# Patient Record
Sex: Male | Born: 1935 | Race: White | Hispanic: No | Marital: Single | Smoking: Never smoker
Health system: Southern US, Community
[De-identification: ages and names within clinical notes are randomized; demographics above are authoritative.]

## PROBLEM LIST (undated history)

## (undated) ENCOUNTER — Emergency Department: Admission: EM | Payer: Medicare Other | Source: Home / Self Care

## (undated) DIAGNOSIS — Z9842 Cataract extraction status, left eye: Secondary | ICD-10-CM

## (undated) DIAGNOSIS — I219 Acute myocardial infarction, unspecified: Secondary | ICD-10-CM

## (undated) DIAGNOSIS — R519 Headache, unspecified: Secondary | ICD-10-CM

## (undated) DIAGNOSIS — R0600 Dyspnea, unspecified: Secondary | ICD-10-CM

## (undated) DIAGNOSIS — I2510 Atherosclerotic heart disease of native coronary artery without angina pectoris: Secondary | ICD-10-CM

## (undated) DIAGNOSIS — M1990 Unspecified osteoarthritis, unspecified site: Secondary | ICD-10-CM

## (undated) DIAGNOSIS — Z9841 Cataract extraction status, right eye: Secondary | ICD-10-CM

## (undated) DIAGNOSIS — M51369 Other intervertebral disc degeneration, lumbar region without mention of lumbar back pain or lower extremity pain: Secondary | ICD-10-CM

## (undated) DIAGNOSIS — I420 Dilated cardiomyopathy: Secondary | ICD-10-CM

## (undated) DIAGNOSIS — I5189 Other ill-defined heart diseases: Secondary | ICD-10-CM

## (undated) DIAGNOSIS — I10 Essential (primary) hypertension: Secondary | ICD-10-CM

## (undated) DIAGNOSIS — I38 Endocarditis, valve unspecified: Secondary | ICD-10-CM

## (undated) DIAGNOSIS — R0609 Other forms of dyspnea: Secondary | ICD-10-CM

## (undated) DIAGNOSIS — E785 Hyperlipidemia, unspecified: Secondary | ICD-10-CM

## (undated) DIAGNOSIS — Z951 Presence of aortocoronary bypass graft: Secondary | ICD-10-CM

## (undated) DIAGNOSIS — M5136 Other intervertebral disc degeneration, lumbar region: Secondary | ICD-10-CM

## (undated) DIAGNOSIS — I6529 Occlusion and stenosis of unspecified carotid artery: Secondary | ICD-10-CM

## (undated) HISTORY — PX: TONSILLECTOMY: SUR1361

## (undated) HISTORY — PX: EYE SURGERY: SHX253

## (undated) HISTORY — PX: ESOPHAGEAL DILATION: SHX303

## (undated) HISTORY — PX: CATARACT EXTRACTION: SUR2

## (undated) HISTORY — PX: CORONARY ARTERY BYPASS GRAFT: SHX141

## (undated) HISTORY — PX: KNEE ARTHROSCOPY: SUR90

## (undated) HISTORY — PX: ROTATOR CUFF REPAIR: SHX139

---

## 2005-07-15 DIAGNOSIS — I219 Acute myocardial infarction, unspecified: Secondary | ICD-10-CM

## 2005-07-15 HISTORY — DX: Acute myocardial infarction, unspecified: I21.9

## 2005-08-04 ENCOUNTER — Inpatient Hospital Stay: Payer: Self-pay | Admitting: Internal Medicine

## 2005-08-04 ENCOUNTER — Other Ambulatory Visit: Payer: Self-pay

## 2005-08-05 DIAGNOSIS — I2510 Atherosclerotic heart disease of native coronary artery without angina pectoris: Secondary | ICD-10-CM

## 2005-08-05 HISTORY — DX: Atherosclerotic heart disease of native coronary artery without angina pectoris: I25.10

## 2005-08-07 DIAGNOSIS — Z951 Presence of aortocoronary bypass graft: Secondary | ICD-10-CM

## 2005-08-07 HISTORY — DX: Presence of aortocoronary bypass graft: Z95.1

## 2005-08-07 HISTORY — PX: CORONARY ARTERY BYPASS GRAFT: SHX141

## 2005-11-10 ENCOUNTER — Encounter: Payer: Self-pay | Admitting: Internal Medicine

## 2005-11-12 ENCOUNTER — Encounter: Payer: Self-pay | Admitting: Internal Medicine

## 2005-12-13 ENCOUNTER — Encounter: Payer: Self-pay | Admitting: Internal Medicine

## 2006-01-12 ENCOUNTER — Encounter: Payer: Self-pay | Admitting: Internal Medicine

## 2006-02-12 ENCOUNTER — Encounter: Payer: Self-pay | Admitting: Internal Medicine

## 2006-12-02 ENCOUNTER — Ambulatory Visit: Payer: Self-pay | Admitting: Gastroenterology

## 2007-10-03 ENCOUNTER — Ambulatory Visit: Payer: Self-pay | Admitting: Emergency Medicine

## 2008-02-24 ENCOUNTER — Ambulatory Visit: Payer: Self-pay | Admitting: Internal Medicine

## 2008-02-26 ENCOUNTER — Ambulatory Visit: Payer: Self-pay | Admitting: Internal Medicine

## 2008-03-13 ENCOUNTER — Ambulatory Visit: Payer: Self-pay | Admitting: Internal Medicine

## 2008-04-04 ENCOUNTER — Ambulatory Visit: Payer: Self-pay | Admitting: Orthopedic Surgery

## 2010-01-30 ENCOUNTER — Ambulatory Visit: Payer: Self-pay | Admitting: Internal Medicine

## 2012-03-23 ENCOUNTER — Ambulatory Visit: Payer: Self-pay | Admitting: Gastroenterology

## 2014-07-04 ENCOUNTER — Ambulatory Visit: Payer: Self-pay | Admitting: Unknown Physician Specialty

## 2014-08-01 ENCOUNTER — Ambulatory Visit: Payer: Self-pay | Admitting: Unknown Physician Specialty

## 2014-08-01 LAB — CBC WITH DIFFERENTIAL/PLATELET
Basophil #: 0.1 10*3/uL (ref 0.0–0.1)
Basophil %: 1.2 %
EOS ABS: 0.4 10*3/uL (ref 0.0–0.7)
Eosinophil %: 4.9 %
HCT: 44.9 % (ref 40.0–52.0)
HGB: 14.9 g/dL (ref 13.0–18.0)
LYMPHS ABS: 1.7 10*3/uL (ref 1.0–3.6)
Lymphocyte %: 23.2 %
MCH: 29.8 pg (ref 26.0–34.0)
MCHC: 33.2 g/dL (ref 32.0–36.0)
MCV: 90 fL (ref 80–100)
Monocyte #: 0.4 x10 3/mm (ref 0.2–1.0)
Monocyte %: 4.8 %
NEUTROS PCT: 65.9 %
Neutrophil #: 5 10*3/uL (ref 1.4–6.5)
Platelet: 240 10*3/uL (ref 150–440)
RBC: 4.99 10*6/uL (ref 4.40–5.90)
RDW: 13.9 % (ref 11.5–14.5)
WBC: 7.5 10*3/uL (ref 3.8–10.6)

## 2014-08-08 ENCOUNTER — Ambulatory Visit: Payer: Self-pay | Admitting: Unknown Physician Specialty

## 2015-01-05 NOTE — Op Note (Signed)
PATIENT NAME:  Gregory [name], Gregory [name] MR#:  [id number] DATE OF BIRTH:  04/09/1932  DATE OF PROCEDURE:  08/08/2014  PREOPERATIVE DIAGNOSIS: Torn right rotator cuff with secondary impingement and  long head of the biceps tendinosis.   POSTOPERATIVE DIAGNOSIS: Torn right rotator cuff with secondary impingement and long head of the biceps tendinosis.  OPERATION: Arthroscopic release of the long head of the biceps tendon followed by arthroscopic subacromial decompression and then mini-incision  rotator cuff repair.   SURGEON: Alda BertholdHarold B. Shadeed Colberg Jr., MD   ANESTHESIA: Interscalene block plus general.   HISTORY: The patient had a long history of right shoulder discomfort. He did have a fall earlier this year. Plain films did not reveal any significant pathology. An MRI was consistent with a large cuff tear and long head of the biceps tendinosis. The patient was ultimately brought in for surgery due to his failure to respond to conservative treatment.   DESCRIPTION OF PROCEDURE: The patient was taken the Operating Room where satisfactory interscalene block was achieved on his right shoulder. Next, satisfactory general anesthesia was achieved. The patient was turned supine and then his right shoulder was prepped and draped in the usual fashion for a procedure about the shoulder. The patient was given 2 grams of Kefzol IV prior to the start of the procedure. The shoulder was suspended with an Acufex shoulder suspension device. The shoulder was maintained in about 30 degrees of abduction and about 10 to 15 degrees of forward flexion and 12 pounds of traction was used throughout most of the procedure.   The scope was introduced through a posterior puncture wound into the glenohumeral joint. The joint was distended with lactated Ringer's. Inspection of the joint revealed that the patient had fraying of the long head of the biceps tendon. There was fraying of the anterior labrum. I went ahead and established an anterior  portal and divided the long head of the biceps tendon at its attachment to the labrum. I used an angled ArthroCare wand to debride the anterior labrum.   Visualization of the subacromial space revealed the patient had a large cuff tear that involved the supraspinatus and a portion of the infraspinatus. It was retracted several centimeters from the greater tuberosity and the tear was probably about 3 to 3.5 cm in length as measured from anterior to posterior.   I used a lateral portal to insert a shaver into the joint to debride the thickened bursal tissue. I also used an ArthroCare wand to further debride this tissue and used it to remove frayed tissue from the undersurface of the acromion. There was a large anterior subacromial spur.   The scope was switched to the lateral portal and an acromionizer bur was brought in through the posterior portal and a subacromial decompression was performed. I then switched the scope back to the posterior portal and brought a large round bur in through the lateral portal and lightly decorticated the greater tuberosity in the area of the intended reattachment of the cuff. I did place a traction suture in the cuff. Indeed the cuff could be mobilized enough to get to the greater tuberosity.   Next, the arthroscopic instruments were removed from the joint, and I went ahead and made a small lateral incision. I basically extended the lateral puncture wound proximally to the edge of the acromion. I dissected down to the deltoid and divided it in line with its fibers. A portion of the anterior deltoid was reflected off of the acromion. A Gelpi  retractor was inserted. I further freed up the cuff superiorly with a large elevator. I then made multiple vent holes in the greater tuberosity with a small pick and then the cuff was repaired to the greater tuberosity with 2 Spartan anchors and two 5.5 speed screws. Horizontal mattress sutures were used to tie the tissue down to the Spartan  anchors and I also used inverted horizontal mattress sutures to bring the cuff anteriorly to the 2 anteriorly placed speed screws. These sutures were tightened down to the speed screws. There was a small longitudinal interval anteriorly that was closed side to side with a #2 non-absorbable suture. All the sutures I used were all #2 non-absorbable sutures. Basically, I repaired the cuff by bringing the more mobile posterior portion of the cuff anteriorly back to the greater tuberosity and as pointed out, I used a 2-row repair.   The wound was irrigated with GU irrigant. I reattached the anterior portion of the deltoid back to the acromion through a drill hole with #1 Ethibond suture. The deltoid interval was closed with a 0 Vicryl and the subcutaneous with 2-0 Vicryl and the skin with skin staples. The anterior and posterior puncture wounds were closed with 3-0 nylon in vertical mattress fashion. Sterile dressing were applied along with 4 TENS pads. A shoulder immobilizer was applied to the right upper extremity. The patient was turned supine and awakened and transferred to a stretcher bed. He was in satisfactory condition at this time. Blood loss was negligible.     ____________________________ Alda Berthold., MD hbk:lm D: 08/08/2014 10:45:06 ET T: 08/08/2014 12:22:24 ET JOB#: [id number]  cc: Alda Berthold., MD, <Dictator> Randon Goldsmith, Montez Hageman MD ELECTRONICALLY SIGNED 09/02/2014 14:58

## 2015-05-27 ENCOUNTER — Other Ambulatory Visit: Payer: Self-pay | Admitting: Physical Medicine and Rehabilitation

## 2015-05-27 DIAGNOSIS — M5417 Radiculopathy, lumbosacral region: Secondary | ICD-10-CM

## 2015-06-02 ENCOUNTER — Encounter: Payer: Self-pay | Admitting: Emergency Medicine

## 2015-06-02 ENCOUNTER — Ambulatory Visit
Admission: EM | Admit: 2015-06-02 | Discharge: 2015-06-02 | Disposition: A | Discharge status: Home / Self Care | Payer: Medicare Other | Attending: Family Medicine | Admitting: Family Medicine

## 2015-06-02 DIAGNOSIS — H7403 Tympanosclerosis, bilateral: Secondary | ICD-10-CM | POA: Diagnosis not present

## 2015-06-02 HISTORY — DX: Atherosclerotic heart disease of native coronary artery without angina pectoris: I25.10

## 2015-06-02 HISTORY — DX: Essential (primary) hypertension: I10

## 2015-06-02 MED ORDER — FLUTICASONE PROPIONATE 50 MCG/ACT NA SUSP: 2.0000 | Freq: Every day | NASAL | Status: DC

## 2015-06-02 NOTE — ED Notes (Signed)
Patient presents here with c/o noise int her t ear, denies any pain and fever

## 2015-06-02 NOTE — Discharge Instructions (Signed)
Otosclerosis Otosclerosis is a type of gradual hearing loss. It is caused by hardening and stiffening (sclerosis) of the bones in the middle part of your ear. These bones transmit sound from your eardrum to the nerve in your inner ear. When the bones do not work properly, the sound transmission gets disrupted, causing hearing loss (conductive hearing loss). The most common site of sclerosis is a tiny bone that connects to your inner ear (stapes).  Otosclerosis usually starts in one ear. In most cases, it will eventually involve both ears.  CAUSES In many cases, the cause of otosclerosis is not known. However, it can be passed down from your parents.  RISK FACTORS Risk factors for otosclerosis include:  Family history of otosclerosis.  Male gender, which doubles your risk.  Pregnancy, which may trigger symptoms.  White or Asian race. SIGNS AND SYMPTOMS The most common symptom is gradual hearing loss that starts in one ear. This symptom is most likely to start between the ages of 15 years and 45 years. Other symptoms may include:  Dizziness.  Roaring, ringing, or buzzing in the ear (tinnitus).  Balance problems. DIAGNOSIS  Your health care provider may suspect otosclerosis if you have gradual hearing loss and a family history of hearing loss. You may need to see an ear specialist (audiologist) to be examined and tested. These tests may include:  A hearing test (audiogram) to check for conductive hearing loss.  A sound vibration test (tympanogram) to check for stiffening of the middle ear bones. TREATMENT  If you have mild hearing loss, you may only need follow-up visits to recheck the level of your hearing loss. However, if you need treatment, it may include:  Use of fluoride medicine (in the early stage of the disease).  Use of a hearing aid.  Surgery to replace the stapes with a prosthetic device (stapedectomy). HOME CARE INSTRUCTIONS  Take all medicines as directed by your  health care provider.  Avoid exposure to loud noises. If you have to be around loud noises, wear ear protection.  Keep all follow-up visits as directed by your health care provider. SEEK MEDICAL CARE IF:  Your hearing loss is getting worse.  You have new symptoms or worsening symptoms with hearing loss.  You have hearing loss while pregnant. SEEK IMMEDIATE MEDICAL CARE IF:  You have a sudden loss of hearing.  You have sudden and severe ear pain, dizziness, or loss of balance. Document Released: 08/28/2000 Document Revised: 01/15/2014 Document Reviewed: 08/14/2013 [facility] Patient Information 2015 [place], [place]. This information is not intended to replace advice given to you by your health care provider. Make sure you discuss any questions you have with your health care provider.

## 2015-06-02 NOTE — ED Provider Notes (Signed)
CSN: [id number]     Arrival date & time 06/02/15  1434 History   None    Chief Complaint  Patient presents with  . Ear Problem   (Consider location/radiation/quality/duration/timing/severity/associated sxs/prior Treatment) HPI  This a very pleasant 81 year old gentleman who presents with perception of something in his right ear for last 2-3 days. States that it feels like a buzzing sound noise that is only when the ambient noise is decreased such as nighttime. He denies any pain itching or discharge is not noticed a decrease in his hearing acuity. He denies being in any altitude been around any loud noises such as jet engines or weapons. He does not feel clogged. Past Medical History  Diagnosis Date  . CAD (coronary artery disease)   . Hypertension    Past Surgical History  Procedure Laterality Date  . Coronary artery bypass graft    . Rotator cuff repair Right    History reviewed. No pertinent family history. Social History  Substance Use Topics  . Smoking status: Never Smoker   . Smokeless tobacco: None  . Alcohol Use: None    Review of Systems  HENT: Negative for congestion, ear discharge, ear pain, hearing loss and trouble swallowing.   All other systems reviewed and are negative.   Allergies  Hydrocodone  Home Medications   Prior to Admission medications   Medication Sig Start Date End Date Taking? Authorizing Provider  aspirin 81 MG tablet Take 81 mg by mouth daily.   Yes Historical Provider, MD  fenofibrate (TRICOR) 145 MG tablet Take 145 mg by mouth daily.   Yes Historical Provider, MD  fluticasone (FLONASE) 50 MCG/ACT nasal spray Place 2 sprays into both nostrils daily. 06/02/15   Lutricia Feil, PA-C   Meds Ordered and Administered this Visit  Medications - No data to display  BP 159/77 mmHg  Pulse 73  Temp(Src) 97.3 F (36.3 C) (Oral)  Resp 20  Ht  (1.727 m)  Wt 184 lb (83.462 kg)  BMI 27.98 kg/m2  SpO2 99% No data found.   Physical Exam   Constitutional: He is oriented to person, place, and time. He appears well-developed and well-nourished. No distress.  HENT:  Head: Normocephalic and atraumatic.  Right Ear: External ear normal.  Left Ear: External ear normal.  Examination of his ears shows sclerosis of the TM bilaterally and there is mild amount of cerumen in the right canal but offers a full view of the TM. Ears no discharge no foreign body seen. His hearing grossly is intact bilaterally  Eyes: Pupils are equal, round, and reactive to light.  Neck: Neck supple.  Musculoskeletal: Normal range of motion. He exhibits no edema or tenderness.  Lymphadenopathy:    He has no cervical adenopathy.  Neurological: He is alert and oriented to person, place, and time.  Skin: Skin is warm and dry. He is not diaphoretic.  Psychiatric: He has a normal mood and affect. His behavior is normal. Judgment and thought content normal.  Nursing note and vitals reviewed.   ED Course  Procedures (including critical care time)  Labs Review Labs Reviewed - No data to display  Imaging Review No results found.   Visual Acuity Review  Right Eye Distance:   Left Eye Distance:   Bilateral Distance:    Right Eye Near:   Left Eye Near:    Bilateral Near:         MDM   1. Sclerosis, tympanic, bilateral    New Prescriptions  FLUTICASONE (FLONASE) 50 MCG/ACT NASAL SPRAY    Place 2 sprays into both nostrils daily.   Plan: 1. Diagnosis reviewed with patient 2. rx as per orders; risks, benefits, potential side effects reviewed with patient 3. Recommend supportive treatment with Flonase 4. F/u prn if symptoms worsen or don't improve follow up with ENT     Lutricia Feil, PA-C 06/02/15 1611

## 2015-06-05 ENCOUNTER — Ambulatory Visit
Admission: RE | Admit: 2015-06-05 | Discharge: 2015-06-05 | Disposition: A | Discharge status: Home / Self Care | Payer: Medicare Other | Source: Ambulatory Visit | Attending: Physical Medicine and Rehabilitation | Admitting: Physical Medicine and Rehabilitation

## 2015-06-05 DIAGNOSIS — M5417 Radiculopathy, lumbosacral region: Secondary | ICD-10-CM

## 2015-06-05 DIAGNOSIS — M5416 Radiculopathy, lumbar region: Secondary | ICD-10-CM | POA: Diagnosis present

## 2016-11-18 ENCOUNTER — Other Ambulatory Visit: Payer: Self-pay | Admitting: Unknown Physician Specialty

## 2016-11-18 DIAGNOSIS — M1711 Unilateral primary osteoarthritis, right knee: Secondary | ICD-10-CM

## 2016-12-01 ENCOUNTER — Ambulatory Visit
Admission: RE | Admit: 2016-12-01 | Discharge: 2016-12-01 | Disposition: A | Discharge status: Home / Self Care | Payer: Medicare Other | Source: Ambulatory Visit | Attending: Unknown Physician Specialty | Admitting: Unknown Physician Specialty

## 2016-12-01 DIAGNOSIS — X58XXXA Exposure to other specified factors, initial encounter: Secondary | ICD-10-CM | POA: Diagnosis not present

## 2016-12-01 DIAGNOSIS — S83241A Other tear of medial meniscus, current injury, right knee, initial encounter: Secondary | ICD-10-CM | POA: Diagnosis not present

## 2016-12-01 DIAGNOSIS — M1711 Unilateral primary osteoarthritis, right knee: Secondary | ICD-10-CM | POA: Insufficient documentation

## 2016-12-01 DIAGNOSIS — M67461 Ganglion, right knee: Secondary | ICD-10-CM | POA: Diagnosis not present

## 2016-12-01 DIAGNOSIS — S83281A Other tear of lateral meniscus, current injury, right knee, initial encounter: Secondary | ICD-10-CM | POA: Insufficient documentation

## 2016-12-01 DIAGNOSIS — M25461 Effusion, right knee: Secondary | ICD-10-CM | POA: Insufficient documentation

## 2017-04-02 ENCOUNTER — Ambulatory Visit
Admission: EM | Admit: 2017-04-02 | Discharge: 2017-04-02 | Disposition: A | Discharge status: Home / Self Care | Payer: Medicare Other | Attending: Family Medicine | Admitting: Family Medicine

## 2017-04-02 ENCOUNTER — Encounter: Payer: Self-pay | Admitting: *Deleted

## 2017-04-02 DIAGNOSIS — H9191 Unspecified hearing loss, right ear: Secondary | ICD-10-CM

## 2017-04-02 DIAGNOSIS — H6991 Unspecified Eustachian tube disorder, right ear: Secondary | ICD-10-CM | POA: Diagnosis not present

## 2017-04-02 NOTE — ED Provider Notes (Signed)
[facility]    CSN: [id number] Arrival date & time: 04/02/17  1557     History   Chief Complaint Chief Complaint  Patient presents with  . Cerumen Impaction    HPI Gregory [name] is a 83 y.o. male.   The history is provided by the patient.  Ear Fullness  This is a new problem. The current episode started more than 2 days ago. The problem occurs constantly. The problem has not changed since onset.Pertinent negatives include no chest pain, no abdominal pain and no headaches. Nothing aggravates the symptoms. He has tried nothing for the symptoms.    Past Medical History:  Diagnosis Date  . CAD (coronary artery disease)   . Hypertension     There are no active problems to display for this patient.   Past Surgical History:  Procedure Laterality Date  . CORONARY ARTERY BYPASS GRAFT    . ROTATOR CUFF REPAIR Right        Home Medications    Prior to Admission medications   Medication Sig Start Date End Date Taking? Authorizing Provider  aspirin 81 MG tablet Take 81 mg by mouth daily.   Yes [provider]  fenofibrate (TRICOR) 145 MG tablet Take 145 mg by mouth daily.   Yes [provider]  fluticasone (FLONASE) 50 MCG/ACT nasal spray Place 2 sprays into both nostrils daily. 06/02/15   Lutricia Feil, PA-C    Family History History reviewed. No pertinent family history.  Social History Social History  Substance Use Topics  . Smoking status: Never Smoker  . Smokeless tobacco: Never Used  . Alcohol use No     Allergies   Hydrocodone   Review of Systems Review of Systems  Cardiovascular: Negative for chest pain.  Gastrointestinal: Negative for abdominal pain.  Neurological: Negative for headaches.     Physical Exam Triage Vital Signs ED Triage Vitals  Enc Vitals Group     BP 04/02/17 1632 135/67     Pulse Rate 04/02/17 1632 61     Resp 04/02/17 1632 16     Temp 04/02/17 1632 98.3 F (36.8 C)     Temp Source  04/02/17 1632 Oral     SpO2 04/02/17 1632 97 %     Weight 04/02/17 1634 195 lb (88.5 kg)     Height 04/02/17 1634 5\' 10"  (1.778 m)     Head Circumference --      Peak Flow --      Pain Score 04/02/17 1635 0     Pain Loc --      Pain Edu? --      Excl. in GC? --    No data found.   Updated Vital Signs BP 135/67 (BP Location: Left Arm)   Pulse 61   Temp 98.3 F (36.8 C) (Oral)   Resp 16   Ht 5\' 10"  (1.778 m)   Wt 195 lb (88.5 kg)   SpO2 97%   BMI 27.98 kg/m   Visual Acuity Right Eye Distance:   Left Eye Distance:   Bilateral Distance:    Right Eye Near:   Left Eye Near:    Bilateral Near:     Physical Exam  Constitutional: He appears well-developed and well-nourished. No distress.  HENT:  Right Ear: Tympanic membrane, external ear and ear canal normal.  Left Ear: Tympanic membrane, external ear and ear canal normal.  Nose: Nose normal.  Mouth/Throat: Oropharynx is clear and moist. No oropharyngeal exudate.  Skin:  He is not diaphoretic.  Nursing note and vitals reviewed.    UC Treatments / Results  Labs (all labs ordered are listed, but only abnormal results are displayed) Labs Reviewed - No data to display  EKG  EKG Interpretation None       Radiology No results found.  Procedures Procedures (including critical care time)  Medications Ordered in UC Medications - No data to display   Initial Impression / Assessment and Plan / UC Course  I have reviewed the triage vital signs and the nursing notes.  Pertinent labs & imaging results that were available during my care of the patient were reviewed by me and considered in my medical decision making (see chart for details).       Final Clinical Impressions(s) / UC Diagnoses   Final diagnoses:  Disorder of right eustachian tube    New Prescriptions Discharge Medication List as of 04/02/2017  5:12 PM     1. diagnosis reviewed with patient 2. Recommend supportive treatment with otc  antihistamines 3. Follow-up prn if symptoms worsen or don't improve   Payton Mccallumonty, Rumaysa Sabatino, MD 04/02/17 [phone]

## 2017-04-02 NOTE — ED Triage Notes (Signed)
Pt c/o muted hearing in right ear. Has had cerumen impaction previously.

## 2017-04-02 NOTE — Discharge Instructions (Signed)
Over the counter antihistamines

## 2017-12-10 IMAGING — MR MR KNEE*R* W/O CM
5 series · 38 of 40 positions shown · non-contrast
Comparison: None.

CLINICAL DATA: Chronic knee pain.  No specific injury.

EXAM:
MRI OF THE RIGHT KNEE WITHOUT CONTRAST
TECHNIQUE: Multiplanar, multisequence MR imaging of the knee was performed. No
intravenous contrast was administered.

[Series 3: PD fat-sat · axial · 3.0mm · 0.33mm/px · z∈[-57,+65]mm · 8 of 38 slices shown (1 of 3)]
[im 1/38]
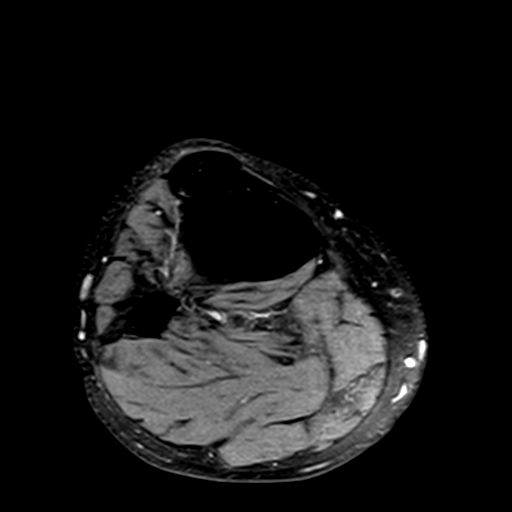
[im 6/38]
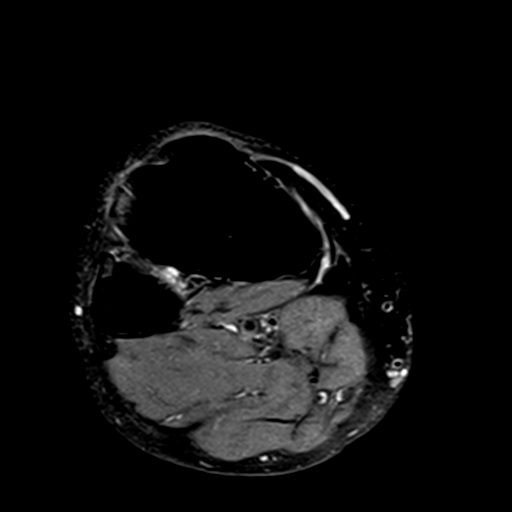
[im 11/38]
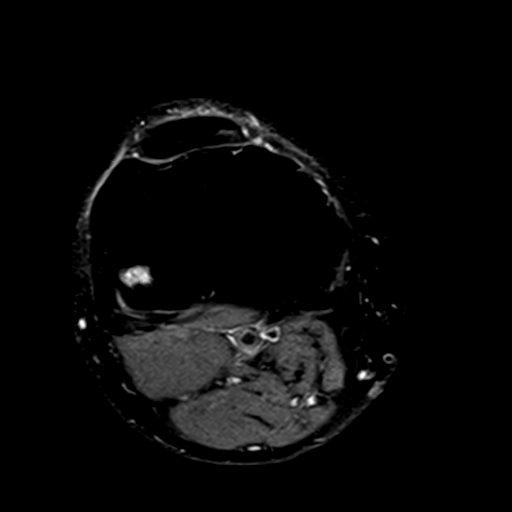
[im 16/38]
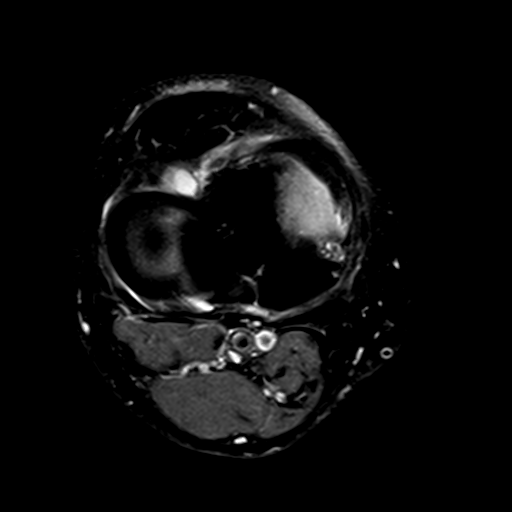
[im 22/38]
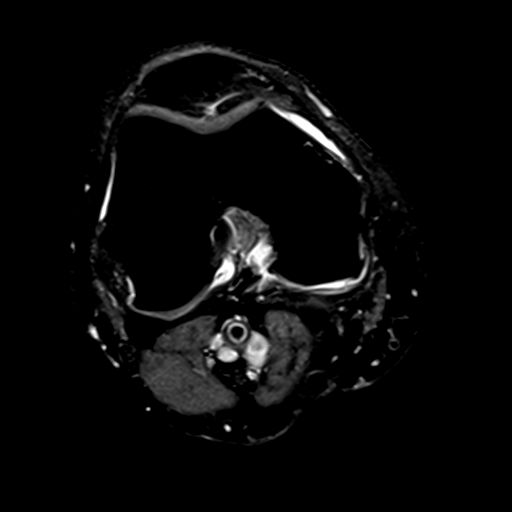
[im 27/38]
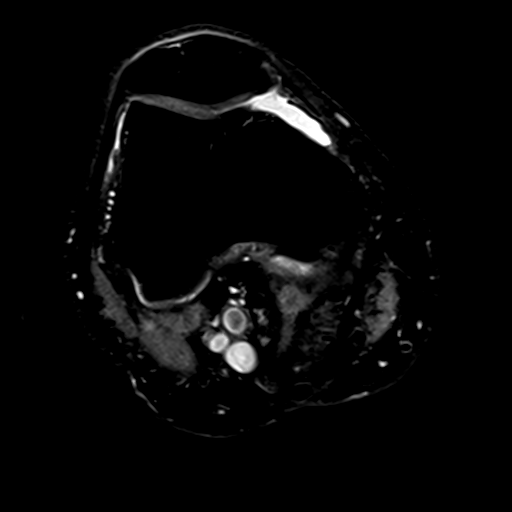
[im 32/38]
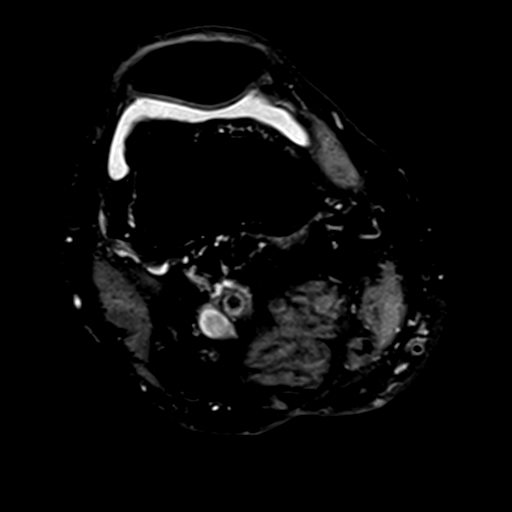
[im 38/38]
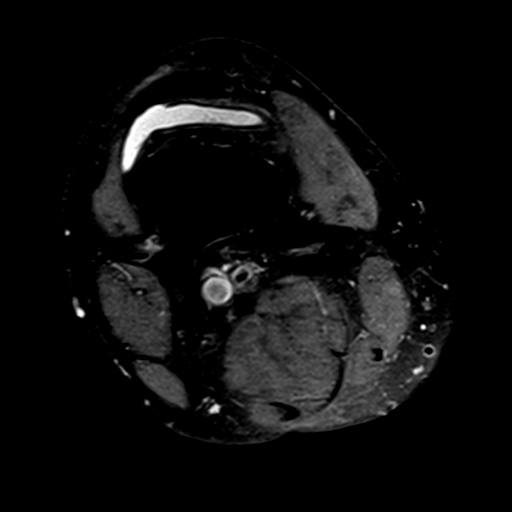

[Series 4: T1 · coronal · 3.0mm · 0.50mm/px · 6 of 35 slices shown]
[im 1/35]
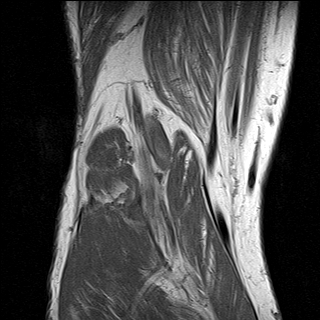
[im 5/35]
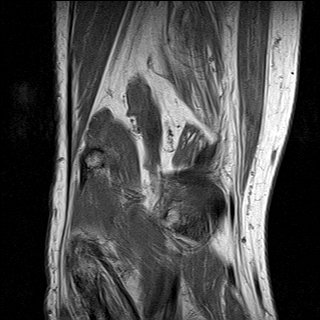
[im 10/35]
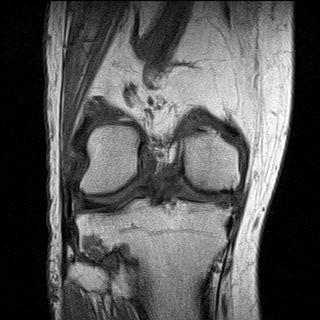
[im 15/35]
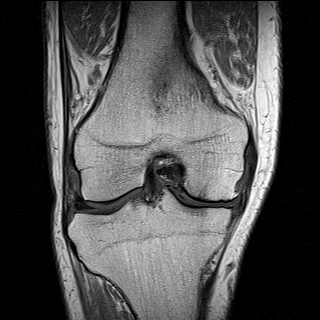
[im 20/35]
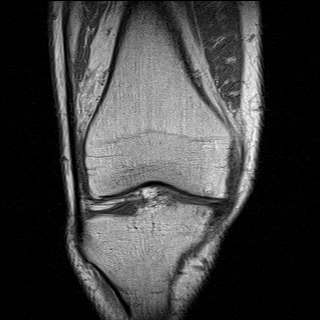
[im 25/35]
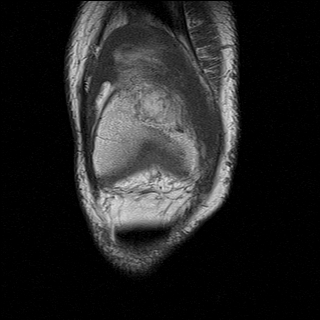

[Series 5: T2 fat-sat · coronal · 3.0mm · 0.50mm/px · 8 of 35 slices shown]
[im 1/35]
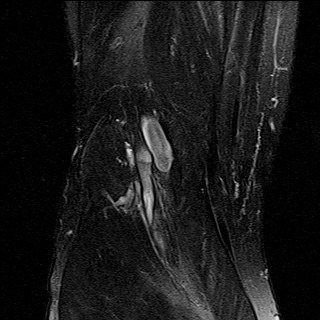
[im 5/35]
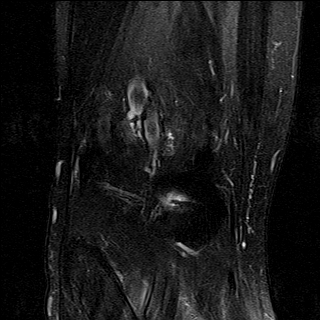
[im 10/35]
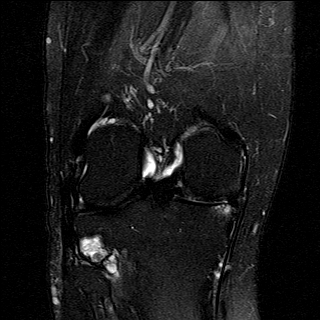
[im 15/35]
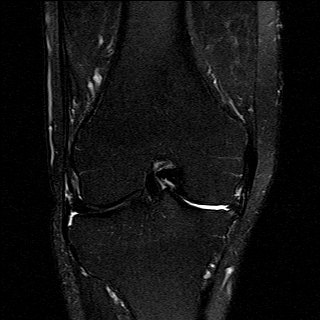
[im 20/35]
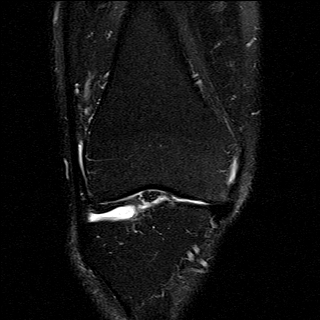
[im 25/35]
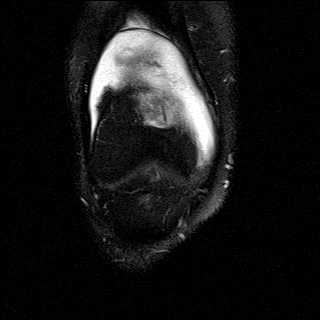
[im 30/35]
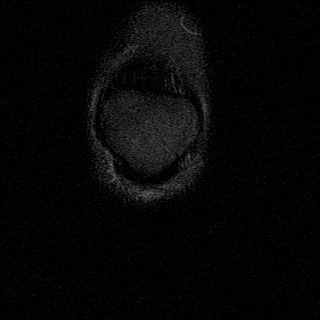
[im 35/35]
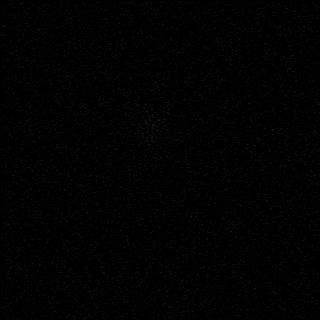

[Series 6: PD fat-sat · coronal · 3.0mm · 0.62mm/px · 8 of 35 slices shown (2 of 3)]
[im 1/35]
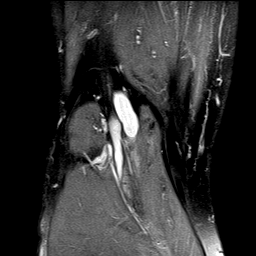
[im 5/35]
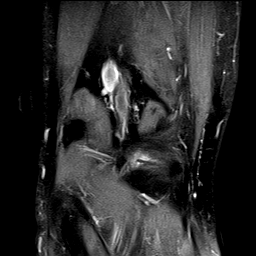
[im 10/35]
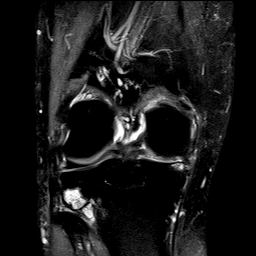
[im 15/35]
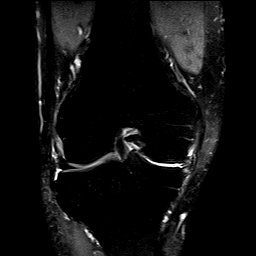
[im 20/35]
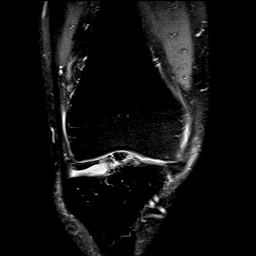
[im 25/35]
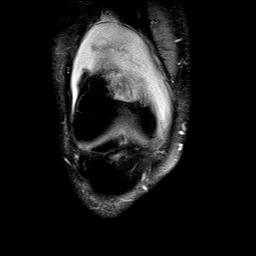
[im 30/35]
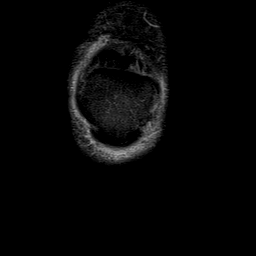
[im 35/35]
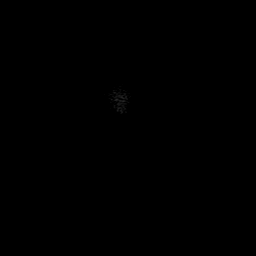

[Series 7: PD fat-sat · sagittal · 3.0mm · 0.62mm/px · 8 of 35 slices shown (3 of 3)]
[im 1/35]
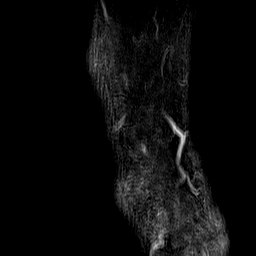
[im 5/35]
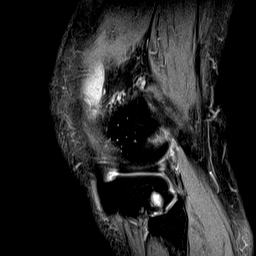
[im 10/35]
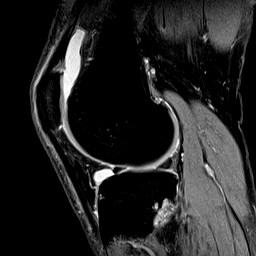
[im 15/35]
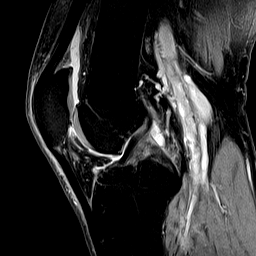
[im 20/35]
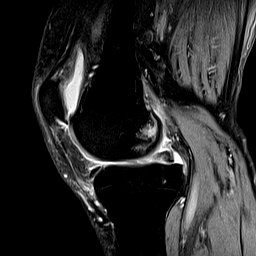
[im 25/35]
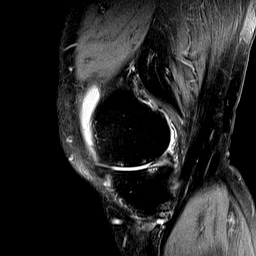
[im 30/35]
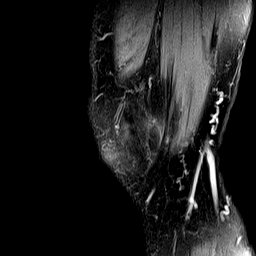
[im 35/35]
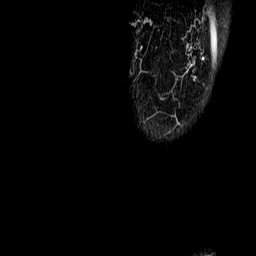

[38 of 40 positions shown; findings below may reference images not displayed]

FINDINGS: MENISCI

Medial meniscus: Degenerated and torn in the midbody region with
moderate maceration. There are also inferior articular surface tears
involving posterior horn.

Lateral meniscus: Small posterior horn tear near the meniscal root
along the inferior articular surface.

LIGAMENTS

Cruciates:  Intact

Collaterals:  Intact

CARTILAGE

Patellofemoral: Mild to moderate degenerative chondrosis for age. No
full-thickness cartilage defects.

Medial: Advanced degenerative chondrosis with areas of
full-thickness cartilage loss, joint space narrowing, osteophytic
spurring and subchondral cystic change.

Lateral: Mild degenerative chondrosis with early osteophytic
spurring.

Joint:  Small joint effusion.  Medial patellar plica is noted.

Popliteal Fossa: No popliteal mass or Baker's cyst. There is an
intraosseous/ extraosseous ganglion cyst associated with the
tibiofibular joint.

Extensor Mechanism: The patella retinacular structures are intact
and the quadriceps and patellar tendons are intact. Mild proximal
and distal patellar tendinopathy.

Bones:  No acute bony findings.

Other: The knee musculature is grossly normal.
IMPRESSION: 1. Degenerated and torn medial meniscus.
2. Small inferior articular surface tear involving the posterior
horn of the lateral meniscus.
3. Intact ligamentous structures and no acute bony findings.
4. Significant medial compartment degenerative changes.
5. Intraosseous/extraosseous ganglion cyst associated with the
tibiofibular joint.
6. Small joint effusion.

## 2019-01-23 ENCOUNTER — Encounter: Payer: Self-pay | Admitting: Emergency Medicine

## 2019-01-23 ENCOUNTER — Other Ambulatory Visit: Payer: Self-pay

## 2019-01-23 ENCOUNTER — Ambulatory Visit
Admission: EM | Admit: 2019-01-23 | Discharge: 2019-01-23 | Disposition: A | Discharge status: Home / Self Care | Payer: Medicare Other | Attending: Family Medicine | Admitting: Family Medicine

## 2019-01-23 DIAGNOSIS — T161XXA Foreign body in right ear, initial encounter: Secondary | ICD-10-CM

## 2019-01-23 NOTE — Discharge Instructions (Signed)
° °  Follow up with your primary care physician this week as needed. Return to Urgent care for new or worsening concerns.  ° °

## 2019-01-23 NOTE — ED Provider Notes (Signed)
[facility] ____________________________________________  Time seen: Approximately 1:55 PM  I have reviewed the triage vital signs and the nursing notes.   HISTORY  Chief Complaint Foreign Body in Ear   HPI [name] is a 85 y.o. male presenting for evaluation of concern of foreign body to right ear for the last 2 days.  Patient states that he was cleaning his right ear out with a Q-tip and the cotton portion disappeared.  States that he had his brother looked in his ear and it was not noted.  Patient states that he feels like there is something in his ear prompting him to come in.  Denies any pain or discomfort.  Denies recent sickness, cough, fevers or congestion.  Reports otherwise feels well and denies other complaints.  No alleviating measures.  Denies aggravating factors.   Past Medical History:  Diagnosis Date  . CAD (coronary artery disease)   . Hypertension     There are no active problems to display for this patient.   Past Surgical History:  Procedure Laterality Date  . CORONARY ARTERY BYPASS GRAFT    . ROTATOR CUFF REPAIR Right      No current facility-administered medications for this encounter.   Current Outpatient Medications:  .  aspirin 81 MG tablet, Take 81 mg by mouth daily., Disp: , Rfl:  .  fenofibrate (TRICOR) 145 MG tablet, Take 145 mg by mouth daily., Disp: , Rfl:  .  fluticasone (FLONASE) 50 MCG/ACT nasal spray, Place 2 sprays into both nostrils daily., Disp: 16 g, Rfl: 0  Allergies Hydrocodone  History reviewed. No pertinent family history.  Social History Social History   Tobacco Use  . Smoking status: Never Smoker  . Smokeless tobacco: Never Used  Substance Use Topics  . Alcohol use: No  . Drug use: No    Review of Systems Constitutional: No fever ENT: No sore throat.  As above. Cardiovascular: Denies chest pain. Respiratory: Denies shortness of breath. Skin: Negative for rash.    ____________________________________________   PHYSICAL EXAM:  VITAL SIGNS: ED Triage Vitals [01/23/19 1348]  Enc Vitals Group     BP 134/80     Pulse Rate 74     Resp 18     Temp 98 F (36.7 C)     Temp Source Oral     SpO2 97 %     Weight 183 lb (83 kg)     Height 5\' 7"  (1.702 m)     Head Circumference      Peak Flow      Pain Score 0     Pain Loc      Pain Edu?      Excl. in GC?     Constitutional: Alert and oriented. Well appearing and in no acute distress. Head: Atraumatic. No sinus tenderness to palpation. No swelling. No erythema.  Ears: Left: Nontender, normal canal, no erythema, normal TM.            Right: Nontender, white cotton in canal removed with alligator forceps, canal then clear, no erythema, no drainage, normal TM.  Nose:No nasal congestion  Mouth/Throat: Mucous membranes are moist.  Cardiovascular: Normal rate, regular rhythm. Grossly normal heart sounds.  Good peripheral circulation. Respiratory: Normal respiratory effort.  No retractions. No wheezes, rales or rhonchi. Good air movement.  Musculoskeletal: Ambulatory with steady gait. Neurologic:  Normal speech and language. No gait instability. Skin:  Skin appears warm, dry. Psychiatric: Mood and affect are normal. Speech and behavior  are normal.  ___________________________________________   LABS (all labs ordered are listed, but only abnormal results are displayed)  Labs Reviewed - No data to display   PROCEDURES Procedures   Procedure explained and verbal consent obtained. Right ear foreign body Sterile alligator forceps utilized to grasp and remove cotton foreign body.  Reexamined, no retained foreign body.  Patient tolerated well.  INITIAL IMPRESSION / ASSESSMENT AND PLAN / ED COURSE  Pertinent labs & imaging results that were available during my care of the patient were reviewed by me and considered in my medical decision making (see chart for details).  Well-appearing patient.  No  acute distress.  Right ear partial Q-tip foreign body found and removed. Supportive care instructions given.  Discussed follow up with Primary care physician this week as needed. Discussed follow up and return parameters including no resolution or any worsening concerns. Patient verbalized understanding and agreed to plan.   ____________________________________________   FINAL CLINICAL IMPRESSION(S) / ED DIAGNOSES  Final diagnoses:  Foreign body of right ear, initial encounter     ED Discharge Orders    None       Note: This dictation was prepared with Dragon dictation along with smaller phrase technology. Any transcriptional errors that result from this process are unintentional.         Renford Dills, NP 01/23/19 1533

## 2019-01-23 NOTE — ED Triage Notes (Signed)
Patient stated he was using a Qtip 2 days ago. He is unsure if there is a piece of the cotton stuck in his right ear. Looked in right ear and there is a piece of cotton stuck on the inside.

## 2020-10-15 ENCOUNTER — Other Ambulatory Visit: Payer: Self-pay | Admitting: Surgery

## 2020-10-17 ENCOUNTER — Other Ambulatory Visit: Payer: Self-pay

## 2020-10-17 ENCOUNTER — Encounter
Admission: RE | Admit: 2020-10-17 | Discharge: 2020-10-17 | Disposition: A | Discharge status: Home / Self Care | Payer: Medicare Other | Source: Ambulatory Visit | Attending: Surgery | Admitting: Surgery

## 2020-10-17 DIAGNOSIS — Z01818 Encounter for other preprocedural examination: Secondary | ICD-10-CM | POA: Insufficient documentation

## 2020-10-17 DIAGNOSIS — I2510 Atherosclerotic heart disease of native coronary artery without angina pectoris: Secondary | ICD-10-CM | POA: Insufficient documentation

## 2020-10-17 DIAGNOSIS — I10 Essential (primary) hypertension: Secondary | ICD-10-CM | POA: Insufficient documentation

## 2020-10-17 HISTORY — DX: Unspecified osteoarthritis, unspecified site: M19.90

## 2020-10-17 HISTORY — DX: Other intervertebral disc degeneration, lumbar region without mention of lumbar back pain or lower extremity pain: M51.369

## 2020-10-17 HISTORY — DX: Acute myocardial infarction, unspecified: I21.9

## 2020-10-17 HISTORY — DX: Endocarditis, valve unspecified: I38

## 2020-10-17 HISTORY — DX: Hyperlipidemia, unspecified: E78.5

## 2020-10-17 HISTORY — DX: Other intervertebral disc degeneration, lumbar region: M51.36

## 2020-10-17 HISTORY — DX: Occlusion and stenosis of unspecified carotid artery: I65.29

## 2020-10-17 NOTE — Patient Instructions (Addendum)
Your procedure is scheduled on:10-23-20 WEDNESDAY Report to the Registration Desk on the 1st floor of the Medical [facility]-Then proceed to the 2nd floor Surgery Desk in the Medical Mall To find out your arrival time, please call [phone] between 1PM - 3PM on:10-22-20 TUESDAY  REMEMBER: Instructions that are not followed completely may result in serious medical risk, up to and including death; or upon the discretion of your surgeon and anesthesiologist your surgery may need to be rescheduled.  Do not eat food after midnight the night before surgery.  No gum chewing, lozengers or hard candies.  You may however, drink CLEAR liquids up to 2 hours before you are scheduled to arrive for your surgery. Do not drink anything within 2 hours of your scheduled arrival time.  Clear liquids include: - water  - apple juice without pulp - gatorade - black coffee or tea (Do NOT add milk or creamers to the coffee or tea) Do NOT drink anything that is not on this list.  In addition, your doctor has ordered for you to drink the provided  Ensure Pre-Surgery Clear Carbohydrate Drink Drinking this carbohydrate drink up to two hours before surgery helps to reduce insulin resistance and improve patient outcomes. Please complete drinking 2 hours prior to scheduled arrival time.  TAKE THESE MEDICATIONS THE MORNING OF SURGERY WITH A SIP OF WATER: -FENOFIBRIC ACID -METOPROLOL (TOPROL)  Follow recommendations from Cardiologist, Pulmonologist or PCP regarding stopping Aspirin, Coumadin, Plavix, Eliquis, Pradaxa, or Pletal-CALL DR POGGI'S OFFICE TODAY AND FIND OUT WHEN YOU NEED TO STOP YOUR 81 MG ASPIRIN-YOU NEED TO STOP YOUR 325 MG ASPIRIN NOW (THIS IS THE ONE YOU TAKE AS NEEDED FOR HEADACHE)   One week prior to surgery: Stop Anti-inflammatories (NSAIDS) such as Advil, Aleve, Ibuprofen, Motrin, Naproxen, Naprosyn and Aspirin based products such as Excedrin, Goodys Powder, BC Powder-OK TO TAKE TYLENOL IF NEEDED  Stop  ANY OVER THE COUNTER supplements until after surgery (HOWEVER YOU MAY CONTINUE YOUR MULTIVITAMIN UP UNTIL THE DAY BEFORE YOUR SURGERY)  No Alcohol for 24 hours before or after surgery.  No Smoking including e-cigarettes for 24 hours prior to surgery.  No chewable tobacco products for at least 6 hours prior to surgery.  No nicotine patches on the day of surgery.  Do not use any "recreational" drugs for at least a week prior to your surgery.  Please be advised that the combination of cocaine and anesthesia may have negative outcomes, up to and including death. If you test positive for cocaine, your surgery will be cancelled.  On the morning of surgery brush your teeth with toothpaste and water, you may rinse your mouth with mouthwash if you wish. Do not swallow any toothpaste or mouthwash.  Do not wear jewelry, make-up, hairpins, clips or nail polish.  Do not wear lotions, powders, or perfumes.   Do not shave body from the neck down 48 hours prior to surgery just in case you cut yourself which could leave a site for infection.  Also, freshly shaved skin may become irritated if using the CHG soap.  Contact lenses, hearing aids and dentures may not be worn into surgery.  Do not bring valuables to the hospital. [facility] is not responsible for any missing/lost belongings or valuables.   Use CHG Soap as directed on instruction sheet.  Notify your doctor if there is any change in your medical condition (cold, fever, infection).  Wear comfortable clothing (specific to your surgery type) to the hospital.  Plan for stool  softeners for home use; pain medications have a tendency to cause constipation. You can also help prevent constipation by eating foods high in fiber such as fruits and vegetables and drinking plenty of fluids as your diet allows.  After surgery, you can help prevent lung complications by doing breathing exercises.  Take deep breaths and cough every 1-2 hours. Your doctor  may order a device called an Incentive Spirometer to help you take deep breaths. When coughing or sneezing, hold a pillow firmly against your incision with both hands. This is called "splinting." Doing this helps protect your incision. It also decreases belly discomfort.  If you are being admitted to the hospital overnight, leave your suitcase in the car. After surgery it may be brought to your room.  If you are being discharged the day of surgery, you will not be allowed to drive home. You will need a responsible adult (18 years or older) to drive you home and stay with you that night.   If you are taking public transportation, you will need to have a responsible adult (18 years or older) with you. Please confirm with your physician that it is acceptable to use public transportation.   Please call the Pre-admissions Testing Dept. at [phone] if you have any questions about these instructions.  Visitation Policy:  Patients undergoing a surgery or procedure may have one family member or support person with them as long as that person is not COVID-19 positive or experiencing its symptoms.  That person may remain in the waiting area during the procedure.  Inpatient Visitation:    Visiting hours are 7 a.m. to 8 p.m. Patients will be allowed one visitor. The visitor may change daily. The visitor must pass COVID-19 screenings, use hand sanitizer when entering and exiting the patient's room and wear a mask at all times, including in the patient's room. Patients must also wear a mask when staff or their visitor are in the room. Masking is required regardless of vaccination status. Systemwide, no visitors 17 or younger.

## 2020-10-21 ENCOUNTER — Other Ambulatory Visit: Payer: Self-pay

## 2020-10-21 ENCOUNTER — Encounter
Admission: RE | Admit: 2020-10-21 | Discharge: 2020-10-21 | Disposition: A | Discharge status: Home / Self Care | Payer: Medicare Other | Source: Ambulatory Visit | Attending: Surgery | Admitting: Surgery

## 2020-10-21 ENCOUNTER — Other Ambulatory Visit: Payer: Medicare Other

## 2020-10-21 ENCOUNTER — Encounter: Payer: Self-pay | Admitting: Surgery

## 2020-10-21 DIAGNOSIS — I2510 Atherosclerotic heart disease of native coronary artery without angina pectoris: Secondary | ICD-10-CM | POA: Diagnosis not present

## 2020-10-21 DIAGNOSIS — Z20822 Contact with and (suspected) exposure to covid-19: Secondary | ICD-10-CM | POA: Insufficient documentation

## 2020-10-21 DIAGNOSIS — I10 Essential (primary) hypertension: Secondary | ICD-10-CM | POA: Diagnosis not present

## 2020-10-21 DIAGNOSIS — Z01818 Encounter for other preprocedural examination: Secondary | ICD-10-CM | POA: Insufficient documentation

## 2020-10-21 LAB — CBC
HCT: 41.7 % (ref 39.0–52.0)
Hemoglobin: 13.6 g/dL (ref 13.0–17.0)
MCH: 29.3 pg (ref 26.0–34.0)
MCHC: 32.6 g/dL (ref 30.0–36.0)
MCV: 89.9 fL (ref 80.0–100.0)
Platelets: 255 10*3/uL (ref 150–400)
RBC: 4.64 MIL/uL (ref 4.22–5.81)
RDW: 13.5 % (ref 11.5–15.5)
WBC: 6 10*3/uL (ref 4.0–10.5)
nRBC: 0 % (ref 0.0–0.2)

## 2020-10-21 LAB — BASIC METABOLIC PANEL
Anion gap: 9 (ref 5–15)
BUN: 22 mg/dL (ref 8–23)
CO2: 26 mmol/L (ref 22–32)
Calcium: 9.3 mg/dL (ref 8.9–10.3)
Chloride: 107 mmol/L (ref 98–111)
Creatinine, Ser: 0.76 mg/dL (ref 0.61–1.24)
GFR, Estimated: >60 mL/min (ref >60)
Glucose, Bld: 92 mg/dL (ref 70–99)
Potassium: 4 mmol/L (ref 3.5–5.1)
Sodium: 142 mmol/L (ref 135–145)

## 2020-10-21 NOTE — Progress Notes (Signed)
[name]  Pre-Admission/Anesthesia Testing Clinical Review  Date: 10/22/20  Patient Demographics:  Name: Gregory [name] DOB:   09-05-1932 MRN:   [id number]  Planned Surgical Procedure(s):    Case: [id number] Date/Time: 10/23/20 0715   Procedure: SUSPENSION ARTHROPLASTY OF LEFT THUMB [facility] JOINT (Left Thumb)   Anesthesia type: Choice   Pre-op diagnosis: Primary osteoarthritis of first carpometacarpal joint of left hand M18.12   Location: [facility] OR ROOM 02 / [facility] ORS FOR ANESTHESIA GROUP   Surgeons: Christena Flake, MD    NOTE: Available PAT nursing documentation and vital signs have been reviewed. Clinical nursing staff has updated patient's PMH/PSHx, current medication list, and drug allergies/intolerances to ensure comprehensive history available to assist in medical decision making as it pertains to the aforementioned surgical procedure and anticipated anesthetic course.   Clinical Discussion:  Gregory [name] is a 87 y.o. male who is submitted for pre-surgical anesthesia review and clearance prior to him undergoing the above procedure. Patient has never been a smoker. Pertinent PMH includes: CAD (s/p CABG), MI, mild dilated cardiomyopathy, valvular heart disease, carotid stenosis, HTN, HLD, DOE, OA, DDD.  Patient is followed by cardiology Gwen Pounds, MD). He was last seen in the cardiology clinic on 03/27/2020; notes reviewed.  At the time of his clinic visit, patient noted to be doing well overall from a cardiovascular perspective. He denied chest pain, shortness of breath, orthopnea, PND, palpitations, peripheral edema, vertiginous symptoms, and presyncope/syncope.  Patient with PMH (+) for CAD. Following a MI in 07/2005, patient underwent a four-vessel CABG (LIMA to LAD, SVG to D1, SVG to OM1, and SVG to PDA.  Last myocardial perfusion imaging study performed on 08/28/2019 demonstrated a minimal basal reversible and fixed inferior myocardial perfusion  defect consistent with small area of possible infarct or ischemia; LVEF 57%.  Subsequent echocardiogram performed on 10/05/2019 revealed normal left ventricular systolic function; EF 45-50%) with mild to moderate valvular insufficiency (see full interpretation of cardiovascular testing and intervention below).  Patient on GDMT for his HTN and HLD diagnoses.  Blood pressure moderately controlled at 142/78 on currently prescribed ACEi and beta-blocker therapies.  Patient is on fibrate and statin therapy for his HLD.  No changes were made to patient's medication regimen.  Patient to follow-up with outpatient cardiology in 6 months or sooner if needed for ongoing management.  Patient is scheduled to undergo an elective orthopedic procedure on 10/23/2020 with Dr. Leron Croak.  Given patient's past medical history significant for cardiovascular disease and intervention, presurgical cardiac clearance was sought by the PAT team.  Per cardiology, "this patient is optimized for surgery and may proceed with the planned procedural course with a LOW risk stratification".  This patient is on daily antiplatelet therapy. He has been instructed on recommendations for holding his daily low-dose ASA by the attending surgeon.   He denies previous perioperative complications with anesthesia.  Vitals with BMI 01/23/2019 04/02/2017 06/02/2015  Height 5\' 7"  5\' 10"  5\' 8"   Weight 183 lbs 195 lbs 184 lbs  BMI 28.66 28 28  Systolic 134 135  Diastolic 80 67 77  Pulse 74 61 73    Providers/Specialists:   NOTE: Primary physician provider listed below. Patient may have been seen by APP or partner within same practice.   PROVIDER ROLE / SPECIALTY LAST OV  Poggi, , MD Orthopedics (Surgeon)  10/18/2020  295, MD Primary Care Provider  08/14/2020  12/16/2020, MD Cardiology  03/27/2020  Allergies:  Hydrocodone  Current Home Medications:   No current facility-administered medications for this  encounter.   Marland Kitchen aspirin EC 325 MG tablet  . aspirin EC 81 MG tablet  . Choline Fenofibrate (FENOFIBRIC ACID) 135 MG CPDR  . lisinopril (ZESTRIL) 20 MG tablet  . metoprolol succinate (TOPROL-XL) 25 MG 24 hr tablet  . Multiple Vitamin (MULTIVITAMIN WITH MINERALS) TABS tablet  . pravastatin (PRAVACHOL) 40 MG tablet   History:   Past Medical History:  Diagnosis Date  . Arthritis   . CAD (coronary artery disease)   . Carotid stenosis   . DDD (degenerative disc disease), lumbar   . Dilated cardiomyopathy (HCC)   . DOE (dyspnea on exertion)   . Hx of CABG   . Hyperlipidemia   . Hypertension   . Myocardial infarction (HCC)   . Valvular heart disease    Past Surgical History:  Procedure Laterality Date  . CORONARY ARTERY BYPASS GRAFT    . ESOPHAGEAL DILATION     x2  . EYE SURGERY Bilateral    cataracts  . KNEE ARTHROSCOPY    . ROTATOR CUFF REPAIR Right   . TONSILLECTOMY     No family history on file. Social History   Tobacco Use  . Smoking status: Never Smoker  . Smokeless tobacco: Never Used  Vaping Use  . Vaping Use: Never used  Substance Use Topics  . Alcohol use: No  . Drug use: No    Pertinent Clinical Results:  LABS: Labs reviewed: Acceptable for surgery.  Hospital Outpatient Visit on 10/21/2020  Component Date Value Ref Range Status  . WBC 10/21/2020 6.0  4.0 - 10.5 K/uL Final  . RBC 10/21/2020 4.64  4.22 - 5.81 MIL/uL Final  . Hemoglobin 10/21/2020 13.6  13.0 - 17.0 g/dL Final  . HCT 33/35/4562 41.7  39.0 - 52.0 % Final  . MCV 10/21/2020 89.9  80.0 - 100.0 fL Final  . MCH 10/21/2020 29.3  26.0 - 34.0 pg Final  . MCHC 10/21/2020 32.6  30.0 - 36.0 g/dL Final  . RDW 56/38/9373 13.5  11.5 - 15.5 % Final  . Platelets 10/21/2020 255  150 - 400 K/uL Final  . nRBC 10/21/2020 0.0  0.0 - 0.2 % Final   Performed at [name], [address], [place], [place] [address]  . Sodium 10/21/2020 142  135 - 145 mmol/L Final  . Potassium 10/21/2020 4.0  3.5 -  5.1 mmol/L Final  . Chloride 10/21/2020 107  98 - 111 mmol/L Final  . CO2 10/21/2020 26  22 - 32 mmol/L Final  . Glucose, Bld 10/21/2020 92  70 - 99 mg/dL Final   Glucose reference range applies only to samples taken after fasting for at least 8 hours.  . BUN 10/21/2020 22  8 - 23 mg/dL Final  . Creatinine, Ser 10/21/2020 0.76  0.61 - 1.24 mg/dL Final  . Calcium 81/15/7262 9.3  8.9 - 10.3 mg/dL Final  . GFR, Estimated 10/21/2020 >60  >60 mL/min Final   Comment: (NOTE) Calculated using the CKD-EPI Creatinine Equation (2021)   . Anion gap 10/21/2020 9  5 - 15 Final   Performed at [facility], [address] [place] [place], [place] [address]  . SARS Coronavirus 2 10/21/2020 NEGATIVE  NEGATIVE Final   Comment: (NOTE) SARS-CoV-2 target nucleic acids are NOT DETECTED.  The SARS-CoV-2 RNA is generally detectable in upper and lower respiratory specimens during the acute phase of infection. Negative results do not preclude SARS-CoV-2  infection, do not rule out co-infections with other pathogens, and should not be used as the sole basis for treatment or other patient management decisions. Negative results must be combined with clinical observations, patient history, and epidemiological information. The expected result is Negative.  Fact Sheet for Patients: HairSlick.no  Fact Sheet for Healthcare Providers: quierodirigir.com  This test is not yet approved or cleared by the [place] FDA and  has been authorized for detection and/or diagnosis of SARS-CoV-2 by FDA under an Emergency Use Authorization (EUA). This EUA will remain  in effect (meaning this test can be used) for the duration of the COVID-19 declaration under Se                          ction 564(b)(1) of the Act, 21 U.S.C. section 360bbb-3(b)(1), unless the authorization is terminated or revoked sooner.  Performed at [name], [address],  [place], [place] [address]     ECG: Date: 10/21/2020 Time ECG obtained: 1039 AM Rate: 71 bpm Rhythm: Sinus rhythm with PVCs Axis (leads I and aVF): Normal Intervals: PR 172 ms. QRS 100 ms. QTc 462 ms. ST segment and T wave changes: No evidence of acute ST segment elevation or depression; evidence of possible age determine inferior infarct noted (cited on or before 08/04/2005); PVCs now present Comparison: Similar to previous tracing obtained on 03/08/2019   IMAGING / PROCEDURES: ECHOCARDIOGRAM performed on 10/05/2019 1. LVEF 45-50% 2. Normal left ventricular systolic function 3. Normal right ventricular systolic function 4. Mild to moderate TR and MR 5. Trace AR; trivial PR 6. No valvular stenosis 7. No evidence of pericardial effusion  LEXISCAN performed on 08/28/2019 1. LVEF 57% 2. Regional wall motion reveals normal myocardial thickening and wall motion 3. Minimal basal reversible and fixed inferior myocardial perfusion defect consistent with small area of possible infarct and/or ischemia 4. No artifacts noted 5. No stress-induced arrhythmias 6. Left ventricular cavity size normal 7. The overall quality of the study is good  Impression and Plan:  JENNER ROSIER has been referred for pre-anesthesia review and clearance prior to him undergoing the planned anesthetic and procedural courses. Available labs, pertinent testing, and imaging results were personally reviewed by me. This patient has been appropriately cleared by cardiology with an overall LOW risk for significant perioperative cardiovascular complications.  Based on clinical review performed today (10/22/20), barring any significant acute changes in the patient's overall condition, it is anticipated that he will be able to proceed with the planned surgical intervention. Any acute changes in clinical condition may necessitate his procedure being postponed and/or cancelled. Pre-surgical instructions were reviewed with the  patient during his PAT appointment and questions were fielded by PAT clinical staff.  Quentin Mulling, MSN, APRN, FNP-C, CEN [facility]  Peri-operative Services Nurse Practitioner Phone: [phone] 10/22/20 5:05 PM  NOTE: This note has been prepared using Dragon dictation software. Despite my best ability to proofread, there is always the potential that unintentional transcriptional errors may still occur from this process.

## 2020-10-22 LAB — SARS CORONAVIRUS 2 (TAT 6-24 HRS): SARS Coronavirus 2: NEGATIVE

## 2020-10-23 ENCOUNTER — Encounter: Payer: Self-pay | Admitting: Surgery

## 2020-10-23 ENCOUNTER — Other Ambulatory Visit: Payer: Self-pay

## 2020-10-23 ENCOUNTER — Ambulatory Visit: Payer: Medicare Other

## 2020-10-23 ENCOUNTER — Ambulatory Visit: Payer: Medicare Other | Admitting: Urgent Care

## 2020-10-23 ENCOUNTER — Ambulatory Visit
Admission: RE | Admit: 2020-10-23 | Discharge: 2020-10-23 | Disposition: A | Discharge status: Home / Self Care | Payer: Medicare Other | Attending: Surgery | Admitting: Surgery

## 2020-10-23 ENCOUNTER — Encounter
Admission: RE | Disposition: A | Discharge status: Home / Self Care | Payer: Self-pay | Source: Home / Self Care | Attending: Surgery

## 2020-10-23 DIAGNOSIS — I420 Dilated cardiomyopathy: Secondary | ICD-10-CM | POA: Diagnosis not present

## 2020-10-23 DIAGNOSIS — E785 Hyperlipidemia, unspecified: Secondary | ICD-10-CM | POA: Diagnosis not present

## 2020-10-23 DIAGNOSIS — I10 Essential (primary) hypertension: Secondary | ICD-10-CM | POA: Insufficient documentation

## 2020-10-23 DIAGNOSIS — Z79899 Other long term (current) drug therapy: Secondary | ICD-10-CM | POA: Insufficient documentation

## 2020-10-23 DIAGNOSIS — Z7982 Long term (current) use of aspirin: Secondary | ICD-10-CM | POA: Insufficient documentation

## 2020-10-23 DIAGNOSIS — M1812 Unilateral primary osteoarthritis of first carpometacarpal joint, left hand: Secondary | ICD-10-CM | POA: Insufficient documentation

## 2020-10-23 DIAGNOSIS — I252 Old myocardial infarction: Secondary | ICD-10-CM | POA: Diagnosis not present

## 2020-10-23 DIAGNOSIS — I6529 Occlusion and stenosis of unspecified carotid artery: Secondary | ICD-10-CM | POA: Insufficient documentation

## 2020-10-23 DIAGNOSIS — Z951 Presence of aortocoronary bypass graft: Secondary | ICD-10-CM | POA: Insufficient documentation

## 2020-10-23 DIAGNOSIS — I2510 Atherosclerotic heart disease of native coronary artery without angina pectoris: Secondary | ICD-10-CM | POA: Insufficient documentation

## 2020-10-23 HISTORY — DX: Other forms of dyspnea: R06.09

## 2020-10-23 HISTORY — DX: Presence of aortocoronary bypass graft: Z95.1

## 2020-10-23 HISTORY — DX: Dilated cardiomyopathy: I42.0

## 2020-10-23 HISTORY — PX: CARPOMETACARPAL (CMC) FUSION OF THUMB: SHX6290

## 2020-10-23 HISTORY — DX: Dyspnea, unspecified: R06.00

## 2020-10-23 SURGERY — CARPOMETACARPAL (CMC) FUSION OF THUMB
Anesthesia: General | Site: Thumb | Laterality: Left

## 2020-10-23 MED ORDER — FENTANYL CITRATE (PF) 100 MCG/2ML IJ SOLN: 25.0000 ug | INTRAMUSCULAR | Status: DC | PRN

## 2020-10-23 MED ORDER — ACETAMINOPHEN 10 MG/ML IV SOLN
1000.0000 mg | Freq: Once | INTRAVENOUS | Status: DC | PRN
Start: 2020-10-23 — End: 2020-10-23

## 2020-10-23 MED ORDER — BUPIVACAINE HCL (PF) 0.5 % IJ SOLN
INTRAMUSCULAR | Status: DC | PRN
  Administered 2020-10-23: 10 mL

## 2020-10-23 MED ORDER — FENTANYL CITRATE (PF) 100 MCG/2ML IJ SOLN
INTRAMUSCULAR | Status: DC | PRN
  Administered 2020-10-23 (×2): 50 ug via INTRAVENOUS

## 2020-10-23 MED ORDER — PROPOFOL 500 MG/50ML IV EMUL
INTRAVENOUS | Status: AC
  Filled 2020-10-23: qty 50

## 2020-10-23 MED ORDER — ORAL CARE MOUTH RINSE: 15.0000 mL | Freq: Once | OROMUCOSAL | Status: AC

## 2020-10-23 MED ORDER — LIDOCAINE HCL (CARDIAC) PF 100 MG/5ML IV SOSY
PREFILLED_SYRINGE | INTRAVENOUS | Status: DC | PRN
  Administered 2020-10-23: 100 mg via INTRAVENOUS

## 2020-10-23 MED ORDER — BUPIVACAINE HCL (PF) 0.5 % IJ SOLN
INTRAMUSCULAR | Status: AC
  Filled 2020-10-23: qty 90

## 2020-10-23 MED ORDER — LACTATED RINGERS IV SOLN: INTRAVENOUS | Status: DC

## 2020-10-23 MED ORDER — CHLORHEXIDINE GLUCONATE 0.12 % MT SOLN: 15.0000 mL | Freq: Once | OROMUCOSAL | Status: AC

## 2020-10-23 MED ORDER — GLYCOPYRROLATE 0.2 MG/ML IJ SOLN
INTRAMUSCULAR | Status: DC | PRN
  Administered 2020-10-23: .2 mg via INTRAVENOUS

## 2020-10-23 MED ORDER — CEFAZOLIN SODIUM-DEXTROSE 2-4 GM/100ML-% IV SOLN
INTRAVENOUS | Status: AC
  Filled 2020-10-23: qty 100

## 2020-10-23 MED ORDER — PHENYLEPHRINE HCL (PRESSORS) 10 MG/ML IV SOLN
INTRAVENOUS | Status: DC | PRN
  Administered 2020-10-23: 100 ug via INTRAVENOUS
  Administered 2020-10-23: 50 ug via INTRAVENOUS

## 2020-10-23 MED ORDER — CHLORHEXIDINE GLUCONATE 0.12 % MT SOLN
OROMUCOSAL | Status: AC
  Administered 2020-10-23: 15 mL via OROMUCOSAL
  Filled 2020-10-23: qty 15

## 2020-10-23 MED ORDER — FENTANYL CITRATE (PF) 100 MCG/2ML IJ SOLN
INTRAMUSCULAR | Status: AC
  Filled 2020-10-23: qty 2

## 2020-10-23 MED ORDER — CEFAZOLIN SODIUM-DEXTROSE 2-4 GM/100ML-% IV SOLN
2.0000 g | INTRAVENOUS | Status: AC
  Administered 2020-10-23: 2 g via INTRAVENOUS

## 2020-10-23 MED ORDER — MIDAZOLAM HCL 2 MG/2ML IJ SOLN
INTRAMUSCULAR | Status: AC
  Filled 2020-10-23: qty 2

## 2020-10-23 MED ORDER — DEXAMETHASONE SODIUM PHOSPHATE 10 MG/ML IJ SOLN
INTRAMUSCULAR | Status: DC | PRN
  Administered 2020-10-23: 5 mg via INTRAVENOUS

## 2020-10-23 MED ORDER — TRAMADOL HCL 50 MG PO TABS
50.0000 mg | ORAL_TABLET | Freq: Four times a day (QID) | ORAL | 0 refills | Status: AC | PRN
Start: 2020-10-23 — End: 2021-10-23

## 2020-10-23 MED ORDER — ONDANSETRON HCL 4 MG/2ML IJ SOLN: 4.0000 mg | Freq: Once | INTRAMUSCULAR | Status: DC | PRN

## 2020-10-23 MED ORDER — PROPOFOL 10 MG/ML IV BOLUS
INTRAVENOUS | Status: AC
  Filled 2020-10-23: qty 40

## 2020-10-23 MED ORDER — PROPOFOL 10 MG/ML IV BOLUS
INTRAVENOUS | Status: DC | PRN
  Administered 2020-10-23: 20 mg via INTRAVENOUS
  Administered 2020-10-23: 100 mg via INTRAVENOUS

## 2020-10-23 SURGICAL SUPPLY — 42 items
BLADE DEBAKEY 8.0 (BLADE) ×2 IMPLANT
BLADE OSC/SAGITTAL 5.5X25 (BLADE) ×2 IMPLANT
BLADE OSC/SAGITTAL MD 9X18.5 (BLADE) ×2 IMPLANT
BLADE SURG 15 STRL LF DISP TIS (BLADE) ×2 IMPLANT
BLADE SURG 15 STRL SS (BLADE) ×4
BNDG COHESIVE 4X5 TAN STRL (GAUZE/BANDAGES/DRESSINGS) ×2 IMPLANT
BNDG CONFORM 3 STRL LF (GAUZE/BANDAGES/DRESSINGS) ×2 IMPLANT
BNDG ELASTIC 3X5.8 VLCR STR LF (GAUZE/BANDAGES/DRESSINGS) ×2 IMPLANT
BNDG ESMARK 4X12 TAN STRL LF (GAUZE/BANDAGES/DRESSINGS) ×4 IMPLANT
BUR 4X55 1 (BURR) ×2 IMPLANT
CAST PADDING 3X4FT ST 30246 (SOFTGOODS) ×2
COVER WAND RF STERILE (DRAPES) ×2 IMPLANT
CUFF TOURN SGL QUICK 18X4 (TOURNIQUET CUFF) ×2 IMPLANT
DRAPE FLUOR MINI C-ARM 54X84 (DRAPES) ×2 IMPLANT
DURAPREP 26ML APPLICATOR (WOUND CARE) ×4 IMPLANT
FORCEPS JEWEL BIP 4-3/4 STR (INSTRUMENTS) ×2 IMPLANT
GAUZE XEROFORM 1X8 LF (GAUZE/BANDAGES/DRESSINGS) ×2 IMPLANT
GLOVE BIO SURGEON STRL SZ8 (GLOVE) ×4 IMPLANT
GLOVE INDICATOR 8.0 STRL GRN (GLOVE) ×2 IMPLANT
GOWN STRL REUS W/ TWL XL LVL3 (GOWN DISPOSABLE) ×1 IMPLANT
GOWN STRL REUS W/TWL XL LVL3 (GOWN DISPOSABLE) ×2
KIT TURNOVER KIT A (KITS) ×2 IMPLANT
MANIFOLD NEPTUNE II (INSTRUMENTS) ×2 IMPLANT
NS IRRIG 500ML POUR BTL (IV SOLUTION) ×2 IMPLANT
PACK EXTREMITY ARMC (MISCELLANEOUS) ×2 IMPLANT
PAD CAST CTTN 3X4 STRL (SOFTGOODS) ×2 IMPLANT
PADDING CAST COTTON 3X4 STRL (SOFTGOODS) ×2
PASSER SUT SWANSON 36MM LOOP (INSTRUMENTS) IMPLANT
SLING ARM LRG DEEP (SOFTGOODS) ×2 IMPLANT
SPLINT CAST 1 STEP 3X12 (MISCELLANEOUS) ×2 IMPLANT
STOCKINETTE IMPERVIOUS 9X36 MD (GAUZE/BANDAGES/DRESSINGS) ×2 IMPLANT
STOCKINETTE STRL 4IN 9604848 (GAUZE/BANDAGES/DRESSINGS) ×2 IMPLANT
STRIP CLOSURE SKIN 1/2X4 (GAUZE/BANDAGES/DRESSINGS) ×2 IMPLANT
SUT ETHIBOND 0 MO6 C/R (SUTURE) ×2 IMPLANT
SUT PROLENE 4 0 PS 2 18 (SUTURE) ×2 IMPLANT
SUT VIC AB 2-0 CT2 27 (SUTURE) ×2 IMPLANT
SUT VIC AB 2-0 SH 27 (SUTURE) ×2
SUT VIC AB 2-0 SH 27XBRD (SUTURE) ×1 IMPLANT
SUT VIC AB 3-0 SH 27 (SUTURE) ×2
SUT VIC AB 3-0 SH 27X BRD (SUTURE) ×1 IMPLANT
SYSTEM IMPLANT TIGHTROPE MINI (Anchor) ×2 IMPLANT
WIRE Z .062 C-WIRE SPADE TIP (WIRE) IMPLANT

## 2020-10-23 NOTE — Transfer of Care (Signed)
Immediate Anesthesia Transfer of Care Note  Patient: Gregory [name]  Procedure(s) Performed: SUSPENSION ARTHROPLASTY OF LEFT THUMB [facility] JOINT (Left Thumb)  Patient Location: PACU  Anesthesia Type:General  Level of Consciousness: awake, alert  and oriented  Airway & Oxygen Therapy: Patient Spontanous Breathing  Post-op Assessment: Report given to RN and Post -op Vital signs reviewed and stable  Post vital signs: Reviewed and stable  Last Vitals:  Vitals Value Taken Time  BP 135/80 10/23/20 0902  Temp    Pulse 78 10/23/20 0907  Resp 19 10/23/20 0907  SpO2 95 % 10/23/20 0907  Vitals shown include unvalidated device data.  Last [name]:  Vitals:   10/23/20 0613  TempSrc: Tympanic  PainSc: 0-No [name]         Complications: No complications documented.

## 2020-10-23 NOTE — H&P (Signed)
History of Present Illness:  Gregory [name] is a 87 y.o. male who is being seen for history and physical for an upcoming left thumb [facility] joint arthroplasty to be done by Dr. Joice Lofts on October 23, 2020. The patient saw Dr. Joice Lofts several months ago. The patient notes that the symptoms have been present for several years and developed after he had some difficulty using a mechanical bulb hole digger in his garden. Apparently it jerked on him suddenly, aggravating his thumb. Since then, he has noted intermittent, but gradually worsening pain in the base of his thumb. He had a particularly painful episode 3 to 4 weeks ago, prompting him to see his primary care provider. X-rays were ordered and he was referred to me for further evaluation and treatment. The patient notes that his symptoms do tend to be intermittent and have indeed improved over the past few weeks. However, he is frustrated by the recurrence of the symptoms and has difficulty grasping and holding objects. He denies any numbness or paresthesias to his fingertips.  Current Outpatient Medications: . aspirin 81 MG EC tablet Take 81 mg by mouth once daily.  . fenofibric (TRILIPIX) 135 mg DR capsule TAKE 1 CAPSULE (135 MG TOTAL) BY MOUTH ONCE DAILY 90 capsule 3  . lisinopriL (ZESTRIL) 20 MG tablet Take 1 tablet (20 mg total) by mouth once daily 90 tablet 3  . metoprolol succinate (TOPROL-XL) 25 MG XL tablet TAKE 1/2 TABLET BY MOUTH ONCE DAILY 45 tablet 3  . multivitamin tablet Take 1 tablet by mouth once daily.  . pravastatin (PRAVACHOL) 40 MG tablet Take 1 tablet (40 mg total) by mouth nightly 90 tablet 3  . psyllium husk, with sugar, (METAMUCIL) 3.4 gram/12 gram oral powder Take 3.4 g by mouth once daily Mix with a full glass (240 mL) of fluid.  Marland Kitchen albuterol 90 mcg/actuation inhaler INHALE 2 INHALATIONS INTO THE LUNGS EVERY 6 (SIX) HOURS AS NEEDED (Patient not taking: Reported on 10/18/2020 ) 8.5 Inhaler 0   Allergies:  . Codeine Nausea  .  Hydrocodone Nausea   Past Medical History:  . Arthritis involving multiple sites 12/18/2014  Right shoulder, knees, back  . Benign essential hypertension 01/17/2015  . Bilateral carotid artery stenosis 03/27/2020 (Less than 50% bilaterally)  . CAD (coronary artery disease) of artery bypass graft 01/02/2014  with LIMA to LAD, SVG to OM1, D1 and RCA, 11/06.  Marland Kitchen Chronic pain of right knee 12/10/2016  . Complete tear of right rotator cuff 07/10/2014  . Coronary artery disease  . 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 DDD (degenerative disc disease), lumbar 06/12/2015  . Degenerative arthritis Right knee.  . Frequent PVCs 09/02/2017  . HTN (hypertension)  . Hyperlipidemia  . Impingement syndrome, shoulder 05/11/2014  . Lumbar radiculitis 06/12/2015  . Lumbar stenosis with neurogenic claudication 06/12/2015  . Mixed hyperlipidemia  . Myocardial infarction (CMS-HCC)  . Primary osteoarthritis of first carpometacarpal joint of left hand 09/18/2020  . Primary osteoarthritis of left knee 11/18/2016  . Primary osteoarthritis of right knee 05/11/2014  . S/P rotator cuff repair 09/10/2014  . SOBOE (shortness of breath on exertion) 11/26/2016  . Status post right unicompartmental knee replacement 05/13/2017  . Valvular heart disease (Mild)   Past Surgical History:  . Arthroscopic release of the long head of the biceps tendon followed by arthroscopic subacromial decompression and then incision rotator cuff repair. Right 08/08/14  . Bilateral cataracts removed  . CARDIAC CATHETERIZATION  .  COLONOSCOPY 11/11/2001, 12/02/2006, 03/23/2012  . CORONARY ARTERY BYPASS GRAFT  . EGD 08/20/1994, 11/17/1995  . Esophageal dilation x2  . HERNIA REPAIR Bilateral  . JOINT REPLACEMENT Right medial Makoplasty 05/10/2017 (Dr. Gavin Potters)  . TONSILLECTOMY (T&A)  . wisdow teeth removal Bilateral   Family History:  . Pancreatic cancer Mother  . Heart disease Father   . Coronary Artery Disease (Blocked arteries around heart) Brother  . Alcohol abuse Other   Social History:   Socioeconomic History:  Marland Kitchen Marital status: Single  Spouse name: Not on file  . Number of children: Not on file  . Years of education: Not on file  . Highest education level: Not on file  Occupational History  . Not on file  Tobacco Use  . Smoking status: Never Smoker  . Smokeless tobacco: Never Used  Substance and Sexual Activity  . Alcohol use: No  Alcohol/week: 0.0 standard drinks  . Drug use: No  . Sexual activity: Defer  Other Topics Concern  . Not on file  Social History Narrative  . Not on file   Social Determinants of Health:   Financial Resource Strain: Not on file  Food Insecurity: Not on file  Transportation Needs: Not on file   Review of Systems:  A comprehensive 14 point ROS was performed, reviewed, and the pertinent orthopaedic findings are documented in the HPI.  Physical Exam: Vitals:  10/18/20 1619  Weight: 81.5 kg (179 lb 9.6 oz)  Height: 170.2 cm (5\' 7" )  PainSc: 3  PainLoc: Finger   General/Constitutional: The patient appears to be well-nourished, well-developed, and in no acute distress. Neuro/Psych: Normal mood and affect, oriented to person, place and time. Eyes: Non-icteric. Pupils are equal, round, and reactive to light, and exhibit synchronous movement. ENT: Unremarkable. Lymphatic: No palpable adenopathy. Respiratory: Lungs clear to auscultation, Normal chest excursion, No wheezes and Non-labored breathing Cardiovascular: Regular rate and rhythm with occasional skipped beats, but no murmurs and No edema, swelling or tenderness, except as noted in detailed exam. Integumentary: No impressive skin lesions present, except as noted in detailed exam. Musculoskeletal: Unremarkable, except as noted in detailed exam.   Heart: Examination of the heart reveals regular, rate, and rhythm. There is no murmur noted on ascultation. There is a  normal apical pulse.  Lungs: Lungs are clear to auscultation. There is no wheeze, rhonchi, or crackles. There is normal expansion of bilateral chest walls.   Left hand exam: Skin inspection of the left hand is notable for some swelling around the base of the left thumb, but otherwise is unremarkable. No erythema, ecchymosis, abrasions, or other skin abnormalities are identified. He has mild tenderness to palpation over the basilar aspect of the left thumb. He also has a positive grind test. He is able to actively flex extend all digits without any pain or triggering. He is neurovascularly intact to all digits.  X-rays/MRI/Lab data:  Recent x-rays of the left hand are available for review and have been reviewed by myself. These films demonstrate evidence of moderate degenerative changes of the left thumb CMC joint as manifest by joint space moderate narrowing, small osteophytes, and subchondral cystic changes. No acute bony abnormalities are identified.  Assessment: Primary osteoarthritis of first carpometacarpal joint of left hand.   Plan: The treatment options were discussed with the patient and his twin brother. In addition, patient educational materials were provided regarding the diagnosis and treatment options. The patient is quite frustrated by his symptoms and functional limitations, and is ready to consider more  aggressive treatment options. He is offered but declines a steroid injection. Therefore, I have recommended a surgical procedure, specifically a suspension arthroplasty of the left thumb CMC joint. The procedure was discussed with the patient, as were the potential risks (including bleeding, infection, nerve and/or blood vessel injury, persistent or recurrent pain, stiffness of the thumb, weakness of the thumb, need for further surgery, blood clots, strokes, heart attacks and/or arhythmias, pneumonia, etc.) and benefits. The patient states his/her understanding and wishes to proceed.  All of the patient's questions and concerns were answered. He can call any time with further concerns. He will follow up post-surgery, routine.   H&P reviewed and patient re-examined. No changes.

## 2020-10-23 NOTE — Anesthesia Preprocedure Evaluation (Signed)
Anesthesia Evaluation  Patient identified by MRN, date of birth, ID band Patient awake    Reviewed: Allergy & Precautions, NPO status , Patient's Chart, lab work & pertinent test results  History of Anesthesia Complications Negative for: history of anesthetic complications  Airway Mallampati: II  TM Distance: >3 FB Neck ROM: Full    Dental no notable dental hx. (+) Teeth Intact   Pulmonary neg pulmonary ROS, neg sleep apnea, neg COPD, Patient abstained from smoking.Not current smoker,    Pulmonary exam normal breath sounds clear to auscultation       Cardiovascular Exercise Tolerance: Good METShypertension, + CAD, + Past MI and + CABG  (-) dysrhythmias + Valvular Problems/Murmurs MR  Rhythm:Regular Rate:Normal - Systolic murmurs ECHOCARDIOGRAM performed on 10/05/2019 1. LVEF 45-50% 2. Normal left ventricular systolic function 3. Normal right ventricular systolic function 4. Mild to moderate TR and MR 5. Trace AR; trivial PR 6. No valvular stenosis 7. No evidence of pericardial effusion  LEXISCAN performed on 08/28/2019 1. LVEF 57% 2. Regional wall motion reveals normal myocardial thickening and wall motion 3. Minimal basal reversible and fixed inferior myocardial perfusion defect consistent with small area of possible infarct and/or ischemia 4. No artifacts noted 5. No stress-induced arrhythmias 6. Left ventricular cavity size normal 7. The overall quality of the study is good    Neuro/Psych negative neurological ROS  negative psych ROS   GI/Hepatic neg GERD  ,(+)     (-) substance abuse  ,   Endo/Other  neg diabetes  Renal/GU negative Renal ROS     Musculoskeletal   Abdominal   Peds  Hematology   Anesthesia Other Findings Past Medical History: No date: Arthritis No date: CAD (coronary artery disease) No date: Carotid stenosis No date: DDD (degenerative disc disease), lumbar No date: Dilated  cardiomyopathy (HCC) No date: DOE (dyspnea on exertion) No date: Hx of CABG No date: Hyperlipidemia No date: Hypertension No date: Myocardial infarction (HCC) No date: Valvular heart disease  Reproductive/Obstetrics                             Anesthesia Physical Anesthesia Plan  ASA: III  Anesthesia Plan: General   Post-op Pain Management:    Induction: Intravenous  PONV Risk Score and Plan: 2 and Ondansetron, Dexamethasone and Treatment may vary due to age or medical condition  Airway Management Planned: LMA  Additional Equipment: None  Intra-op Plan:   Post-operative Plan: Extubation in OR  Informed Consent: I have reviewed the patients History and Physical, chart, labs and discussed the procedure including the risks, benefits and alternatives for the proposed anesthesia with the patient or authorized representative who has indicated his/her understanding and acceptance.     Dental advisory given  Plan Discussed with: CRNA and Surgeon  Anesthesia Plan Comments: (Discussed risks of anesthesia with patient, including PONV, sore throat, lip/dental damage. Rare risks discussed as well, such as cardiorespiratory and neurological sequelae. Patient understands.)        Anesthesia Quick Evaluation

## 2020-10-23 NOTE — Op Note (Signed)
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  Post-Op Diagnosis:   Same.  Procedure:   Suspension arthroplasty left thumb CMC joint.   Surgeon:   Maryagnes Amos, MD  Assistant:   None  Anesthesia:   General LMA  Findings:   As above.  Complications:   None  EBL:   0 cc  Fluids:   600 cc crystalloid  TT:   57 minutes at 250 mmHg  Drains:   None  Closure:   3-0 Vicryl subcuticular sutures  Implants:   Arthrex 1.1 mm CMC Mini Tightrope.  Brief Clinical Note:   The patient is an 87 year old male with a long history of gradually worsening basilar left thumb [name]. The patient's symptoms have progressed despite medications, activity modification, injections, splinting, etc. The patient's history and examination are consistent with advanced degenerative joint disease of the left thumb CMC joint which was confirmed by plain radiographs. The patient presents at this time for a suspension arthroplasty of the left thumb CMC joint.  Procedure:    The patient was brought into the operating room and lain in the supine position. After adequate general laryngeal mask anesthesia was obtained, the patient's left hand and upper extremity were prepped with ChloraPrep solution before being draped sterilely. Preoperative antibiotics were administered. After verifying the appropriate surgical site with a timeout, the limb was exsanguinated with an Esmarch and the tourniquet inflated to 250 mmHg.   An approximately 4-5 cm longitudinal incision was made centered over the radial aspect of the thumb CMC joint. The incision was carried down through the subcutaneous cutaneous tissues with care taken to identify and protect the local neurovascular structures. Several small veins were cauterized with electrocautery. A longitudinal incision was made through the capsular tissues over the dorsal aspect of the thumb CMC joint. Subperiosteal  dissection was carried out to expose the trapezium dorsally and volarly. Once the [facility] and scaphotrapezial joints were identified, a sagittal cut was made in the trapezium using the micro-oscillating saw. This cut extended approximately two-thirds down through the bone. A Hoke osteotome was used to complete transection of the bone. The bone was then removed piecemeal using rongeurs and further dissection. The flexor carpi radialis tendon was identified at the base of the defect. The base of the thumb metacarpal was prepared by exposing its radial base.  The Arthrex 1.1 mm guidewire was drilled up through the proximal end of the thumb metacarpal beginning at the dorsoradial surface and advancing into the volar radial aspect of the proximal index metacarpal shaft. The adequacy of pin position was verified using FluoroScan imaging in AP, lateral, and oblique projections and found to be excellent.  An approximate 1.5 cm incision was made over the dorsal aspect of the proximal end of the index metacarpal. The incision was carried down through the subcutaneous tissues with care taken to avoid damaging the extensor tendon. The interosseous muscle was dissected away from the dorso-ulnar aspect of the proximal index metacarpal shaft before the guidewire was advanced through the final cortex into this wound. The suture was passed through the nitinol wire loop and pulled across the thumb and index metacarpals, seating the mini-Endobutton against the proximal end of the thumb metacarpal. The suture was cut and each and passed through the second mini Endobutton. The Endobutton was advanced to rest against the dorsal ulnar aspect of the proximal index metacarpal shaft before it was  tied securely with appropriate tension to stabilize the base of the thumb metacarpal against the base of the index metacarpal. Again, the adequacy of Endobutton position and metacarpal reduction was verified fluoroscopically in AP, lateral, and  oblique projections and found to be excellent. The thumb was then placed through a range of motion. It was able to be abducted and adducted fully and remained distracted to gentle axial loading.  The wounds were copiously irrigated with sterile saline solution using bulb irrigation. The thumb CMC capsular tissues were reapproximated using 2-0 Vicryl interrupted sutures before the skin was closed using 3-0 Vicryl subcuticular interrupted sutures. Benzoin and Steri-Strips were applied to the skin. The skin of the more dorsal incision was closed using 4-0 Prolene interrupted sutures. A total of 10 cc of 0.5% plain Sensorcaine was injected in and around both incisions to help with postoperative analgesia. A sterile bulky dressing was applied to the wounds before the patient was placed into a fiberglass thumb spica splint maintaining the thumb in the position of function. The patient was then awakened, extubated, and returned to the recovery room in satisfactory condition after tolerating the procedure well.

## 2020-10-23 NOTE — Discharge Instructions (Addendum)
Orthopedic discharge instructions: Keep splint dry and intact. Keep hand elevated above heart level. Apply ice to affected area frequently. Take aspirin or ibuprofen 600 mg TID with meals for 5-7 days, then as necessary. Take pain medication as prescribed or ES Tylenol when needed.  Return for follow-up in 10-14 days or as scheduled.   AMBULATORY SURGERY  DISCHARGE INSTRUCTIONS   1) The drugs that you were given will stay in your system until tomorrow so for the next 24 hours you should not:  A) Drive an automobile B) Make any legal decisions C) Drink any alcoholic beverage   2) You may resume regular meals tomorrow.  Today it is better to start with liquids and gradually work up to solid foods.  You may eat anything you prefer, but it is better to start with liquids, then soup and crackers, and gradually work up to solid foods.   3) Please notify your doctor immediately if you have any unusual bleeding, trouble breathing, redness and pain at the surgery site, drainage, fever, or pain not relieved by medication.  4) Your post-operative visit with Dr.                                     is: Date:                        Time:    Please call to schedule your post-operative visit.  5) Additional Instructions:

## 2020-10-23 NOTE — Anesthesia Postprocedure Evaluation (Signed)
Anesthesia Post Note  Patient: Gregory [name]  Procedure(s) Performed: SUSPENSION ARTHROPLASTY OF LEFT THUMB CMC JOINT (Left Thumb)  Patient location during evaluation: PACU Anesthesia Type: General Level of consciousness: awake and alert Pain management: pain level controlled Vital Signs Assessment: post-procedure vital signs reviewed and stable Respiratory status: spontaneous breathing, nonlabored ventilation, respiratory function stable and patient connected to nasal cannula oxygen Cardiovascular status: blood pressure returned to baseline and stable Postop Assessment: no apparent nausea or vomiting Anesthetic complications: no   No complications documented.   Last Vitals:  Vitals:   10/23/20 0930 10/23/20 0940  BP: 133/74 (!) 154/68  Pulse: 62 79  Resp: 14 16  Temp: (!) 36.2 C 36.6 C  SpO2: 99% 99%    Last Pain:  Vitals:   10/23/20 0940  TempSrc: Oral  PainSc: 1                  Corinda Gubler

## 2020-10-23 NOTE — Anesthesia Procedure Notes (Signed)
Procedure Name: LMA Insertion Date/Time: 10/23/2020 7:41 AM Performed by: Lanora Manis, CRNA Pre-anesthesia Checklist: Patient identified, Patient being monitored, Timeout performed, Emergency Drugs available and Suction available Patient Re-evaluated:Patient Re-evaluated prior to induction Oxygen Delivery Method: Circle system utilized Preoxygenation: Pre-oxygenation with 100% oxygen Induction Type: IV induction Ventilation: Mask ventilation without difficulty LMA: LMA inserted LMA Size: 4.0 Tube type: Oral Number of attempts: 2 Placement Confirmation: positive ETCO2 and breath sounds checked- equal and bilateral Tube secured with: Tape Dental Injury: Teeth and Oropharynx as per pre-operative assessment

## 2021-11-20 ENCOUNTER — Other Ambulatory Visit: Payer: Self-pay

## 2021-11-20 ENCOUNTER — Ambulatory Visit
Admission: EM | Admit: 2021-11-20 | Discharge: 2021-11-20 | Disposition: A | Discharge status: Home / Self Care | Payer: Medicare Other | Attending: Physician Assistant | Admitting: Physician Assistant

## 2021-11-20 ENCOUNTER — Encounter: Payer: Self-pay | Admitting: Emergency Medicine

## 2021-11-20 DIAGNOSIS — M62838 Other muscle spasm: Secondary | ICD-10-CM

## 2021-11-20 DIAGNOSIS — M542 Cervicalgia: Secondary | ICD-10-CM

## 2021-11-20 MED ORDER — KETOROLAC TROMETHAMINE 60 MG/2ML IM SOLN
30.0000 mg | Freq: Once | INTRAMUSCULAR | Status: AC
  Administered 2021-11-20: 11:00:00 30 mg via INTRAMUSCULAR

## 2021-11-20 MED ORDER — BACLOFEN 10 MG PO TABS: 10.0000 mg | ORAL_TABLET | Freq: Three times a day (TID) | ORAL | 0 refills | Status: AC | PRN

## 2021-11-20 MED ORDER — PREDNISONE 20 MG PO TABS: 40.0000 mg | ORAL_TABLET | Freq: Every day | ORAL | 0 refills | Status: AC

## 2021-11-20 NOTE — ED Triage Notes (Signed)
Neck pain for 3 days.  Pain is specifically right side of cervical spine is hurting.  No known injury.  Patient has used lidocaine and tylenol with no relief ?

## 2021-11-20 NOTE — Discharge Instructions (Signed)
I have sent prednisone for inflammation to the pharmacy.  You can take Tylenol for pain if you need to.  I have also sent baclofen which is a muscle relaxer to help with spasms. ? ?Purchase over-the-counter IcyHot cream. ? ?NECK PAIN: Stressed avoiding painful activities. This can exacerbate your symptoms and make them worse.  May apply heat to the areas of pain for some relief. Use medications as directed. Be aware of which medications make you drowsy and do not drive or operate any kind of heavy machinery while using the medication (ie pain medications or muscle relaxers). F/U with PCP for reexamination or return sooner if condition worsens or does not begin to improve over the next few days.  ? ?NECK PAIN RED FLAGS: If symptoms get worse than they are right now, you should come back sooner for re-evaluation. If you have increased numbness/ tingling or notice that the numbness/tingling is affecting the legs or saddle region, go to ER. If you ever lose continence go to ER.     ?

## 2021-11-20 NOTE — ED Provider Notes (Signed)
[facility]    CSN: [id number] Arrival date & time: 11/20/21  [id number]      History   Chief Complaint Chief Complaint  Patient presents with   neck    HPI Gregory [name] is a 87 y.o. male presenting for 3-day history of right sided neck pain.  Patient reports the left side of his neck was initially hurting but now feels okay.  He says he has increased pain when he extends his neck and rotates it toward the right.  The pain is cramping and aching in nature with sudden sharp pains in the muscle.  Patient denies any radiation of pain to the lower extremity.  No numbness, tingling or weakness.  No headaches, dizziness, vision changes.  Denies chest pain or breathing trouble.  Has been using lidocaine patches and Tylenol without improvement in pain.  Patient says he has never had anything like this before.  No other concerns.  HPI  Past Medical History:  Diagnosis Date   Arthritis    CAD (coronary artery disease)    Carotid stenosis    DDD (degenerative disc disease), lumbar    Dilated cardiomyopathy ([place])    DOE (dyspnea on exertion)    Hx of CABG    Hyperlipidemia    Hypertension    Myocardial infarction ([place])    Valvular heart disease     There are no problems to display for this patient.   Past Surgical History:  Procedure Laterality Date   CARPOMETACARPAL ([place]) FUSION OF THUMB Left 10/23/2020   Procedure: SUSPENSION ARTHROPLASTY OF LEFT THUMB [place] JOINT;  Surgeon: Corky Mull, MD;  Location: [facility] ORS;  Service: Orthopedics;  Laterality: Left;   CORONARY ARTERY BYPASS GRAFT     ESOPHAGEAL DILATION     x2   EYE SURGERY Bilateral    cataracts   KNEE ARTHROSCOPY     ROTATOR CUFF REPAIR Right    TONSILLECTOMY         Home Medications    Prior to Admission medications   Medication Sig Start Date End Date Taking? Authorizing Provider  baclofen (LIORESAL) 10 MG tablet Take 1 tablet (10 mg total) by mouth 3 (three) times daily as needed for up to 5 days for  muscle spasms. 11/20/21 11/25/21 Yes Danton Clap, PA-C  predniSONE (DELTASONE) 20 MG tablet Take 2 tablets (40 mg total) by mouth daily for 5 days. 11/20/21 11/25/21 Yes Danton Clap, PA-C  aspirin EC 325 MG tablet Take 325-650 mg by mouth every 6 (six) hours as needed (headaches.).    [provider]  aspirin EC 81 MG tablet Take 81 mg by mouth every morning. Swallow whole.    [provider]  Choline Fenofibrate (FENOFIBRIC ACID) 135 MG CPDR Take 135 mg by mouth every morning.    [provider]  lisinopril (ZESTRIL) 20 MG tablet Take 20 mg by mouth every morning.    [provider]  metoprolol succinate (TOPROL-XL) 25 MG 24 hr tablet Take 12.5 mg by mouth every morning.    [provider]  Multiple Vitamin (MULTIVITAMIN WITH MINERALS) TABS tablet Take 1 tablet by mouth daily.    [provider]  pravastatin (PRAVACHOL) 40 MG tablet Take 40 mg by mouth every evening.    [provider]    Family History History reviewed. No pertinent family history.  Social History Social History   Tobacco Use   Smoking status: Never   Smokeless tobacco: Never  Vaping Use  Vaping Use: Never used  Substance Use Topics   Alcohol use: No   Drug use: No     Allergies   Hydrocodone   Review of Systems Review of Systems  Constitutional:  Negative for fatigue and fever.  Respiratory:  Negative for shortness of breath.   Cardiovascular:  Negative for chest pain.  Gastrointestinal:  Negative for nausea and vomiting.  Musculoskeletal:  Positive for neck pain and neck stiffness. Negative for arthralgias and back pain.  Skin:  Negative for rash.  Neurological:  Negative for dizziness, weakness, numbness and headaches.    Physical Exam Triage Vital Signs ED Triage Vitals  Enc Vitals Group     BP      Pulse      Resp      Temp      Temp src      SpO2      Weight      Height      Head Circumference      Peak Flow      Pain  Score      Pain Loc      Pain Edu?      Excl. in [place]?    No data found.  Updated Vital Signs BP (!) 167/65 (BP Location: Left Arm)    Pulse 76    Temp 98.5 F (36.9 C) (Oral)    Resp 20    SpO2 98%       Physical Exam Vitals and nursing note reviewed.  Constitutional:      General: He is not in acute distress.    Appearance: Normal appearance. He is well-developed. He is not ill-appearing.  HENT:     Head: Normocephalic and atraumatic.  Eyes:     General: No scleral icterus.    Extraocular Movements: Extraocular movements intact.     Conjunctiva/sclera: Conjunctivae normal.     Pupils: Pupils are equal, round, and reactive to light.  Cardiovascular:     Rate and Rhythm: Normal rate and regular rhythm.     Heart sounds: Normal heart sounds.  Pulmonary:     Effort: Pulmonary effort is normal. No respiratory distress.     Breath sounds: Normal breath sounds.  Musculoskeletal:     Cervical back: Neck supple. Spasms and tenderness (TTP diffusely along the right paracervical muscles and right trapezius) present. No swelling or bony tenderness. Pain with movement (extension and rotation to right) present. Decreased range of motion.     Comments: 5/5 strength bilat UEs  Skin:    General: Skin is warm and dry.     Capillary Refill: Capillary refill takes less than 2 seconds.  Neurological:     General: No focal deficit present.     Mental Status: He is alert and oriented to person, place, and time. Mental status is at baseline.     Motor: No weakness.     Coordination: Coordination normal.     Gait: Gait normal.  Psychiatric:        Mood and Affect: Mood normal.        Behavior: Behavior normal.        Thought Content: Thought content normal.     UC Treatments / Results  Labs (all labs ordered are listed, but only abnormal results are displayed) Labs Reviewed - No data to display  EKG   Radiology No results found.  Procedures Procedures (including critical care  time)  Medications Ordered in UC Medications  ketorolac (TORADOL) injection 30 mg (  30 mg Intramuscular Given 11/20/21 1045)    Initial Impression / Assessment and Plan / UC Course  I have reviewed the triage vital signs and the nursing notes.  Pertinent labs & imaging results that were available during my care of the patient were reviewed by me and considered in my medical decision making (see chart for details).  88 year old male presenting for right neck pain over the past 3 days.  Denies any injury.  States he woke up with the pain.  It is aching in nature with occasional sudden and sharp pains especially when he extends his neck and rotates to the right.  Pain does not radiate and not associated with numbness, tingling or weakness.  No fevers or URI symptoms.  Normal neurological exam.  On exam he does have reduced range of motion of his neck especially with extension and rotation to right.  Tenderness to palpation along the right paracervical muscles and trapezius.  5 out of 5 strength bilateral upper and lower extremities.  Symptoms consistent with muscle spasms and likely strain of neck.  Patient requesting something for pain at this time.  Patient given 30 mg IM ketorolac for acute pain.  Sent prednisone to pharmacy as well as baclofen.  Also encouraged him to continue Tylenol and try IcyHot.  Stretches and heating pad.  Reviewed return to ED precautions relating to neck.  Patient.   Final Clinical Impressions(s) / UC Diagnoses   Final diagnoses:  Cervicalgia  Muscle spasms of neck     Discharge Instructions      I have sent prednisone for inflammation to the pharmacy.  You can take Tylenol for pain if you need to.  I have also sent baclofen which is a muscle relaxer to help with spasms.  Purchase over-the-counter IcyHot cream.  NECK PAIN: Stressed avoiding painful activities. This can exacerbate your symptoms and make them worse.  May apply heat to the areas of pain for some  relief. Use medications as directed. Be aware of which medications make you drowsy and do not drive or operate any kind of heavy machinery while using the medication (ie pain medications or muscle relaxers). F/U with PCP for reexamination or return sooner if condition worsens or does not begin to improve over the next few days.   NECK PAIN RED FLAGS: If symptoms get worse than they are right now, you should come back sooner for re-evaluation. If you have increased numbness/ tingling or notice that the numbness/tingling is affecting the legs or saddle region, go to ER. If you ever lose continence go to ER.         ED Prescriptions     Medication Sig Dispense Auth. Provider   baclofen (LIORESAL) 10 MG tablet Take 1 tablet (10 mg total) by mouth 3 (three) times daily as needed for up to 5 days for muscle spasms. 15 each Danton Clap, PA-C   predniSONE (DELTASONE) 20 MG tablet Take 2 tablets (40 mg total) by mouth daily for 5 days. 10 tablet Gretta Cool      PDMP not reviewed this encounter.   Danton Clap, PA-C 11/20/21 1053

## 2022-11-13 ENCOUNTER — Other Ambulatory Visit: Payer: Self-pay | Admitting: Surgery

## 2022-11-13 DIAGNOSIS — M1712 Unilateral primary osteoarthritis, left knee: Secondary | ICD-10-CM

## 2022-11-26 ENCOUNTER — Other Ambulatory Visit: Payer: Medicare Other

## 2022-11-27 ENCOUNTER — Other Ambulatory Visit: Payer: Self-pay | Admitting: Surgery

## 2022-12-03 ENCOUNTER — Other Ambulatory Visit: Payer: Medicare Other

## 2022-12-04 ENCOUNTER — Encounter
Admission: RE | Admit: 2022-12-04 | Discharge: 2022-12-04 | Disposition: A | Discharge status: Home / Self Care | Payer: Medicare Other | Source: Ambulatory Visit | Attending: Surgery | Admitting: Surgery

## 2022-12-04 VITALS — BP 149/73 | HR 76 | Resp 16 | Wt 174.6 lb

## 2022-12-04 DIAGNOSIS — Z01818 Encounter for other preprocedural examination: Secondary | ICD-10-CM | POA: Diagnosis present

## 2022-12-04 DIAGNOSIS — Z01812 Encounter for preprocedural laboratory examination: Secondary | ICD-10-CM

## 2022-12-04 HISTORY — DX: Headache, unspecified: R51.9

## 2022-12-04 LAB — CBC WITH DIFFERENTIAL/PLATELET
Abs Immature Granulocytes: 0.02 10*3/uL (ref 0.00–0.07)
Basophils Absolute: 0.1 10*3/uL (ref 0.0–0.1)
Basophils Relative: 2 %
Eosinophils Absolute: 0.4 10*3/uL (ref 0.0–0.5)
Eosinophils Relative: 8 %
HCT: 41.5 % (ref 39.0–52.0)
Hemoglobin: 13.8 g/dL (ref 13.0–17.0)
Immature Granulocytes: 0 %
Lymphocytes Relative: 29 %
Lymphs Abs: 1.5 10*3/uL (ref 0.7–4.0)
MCH: 29.8 pg (ref 26.0–34.0)
MCHC: 33.3 g/dL (ref 30.0–36.0)
MCV: 89.6 fL (ref 80.0–100.0)
Monocytes Absolute: 0.3 10*3/uL (ref 0.1–1.0)
Monocytes Relative: 6 %
Neutro Abs: 2.9 10*3/uL (ref 1.7–7.7)
Neutrophils Relative %: 55 %
Platelets: 239 10*3/uL (ref 150–400)
RBC: 4.63 MIL/uL (ref 4.22–5.81)
RDW: 13.8 % (ref 11.5–15.5)
WBC: 5.1 10*3/uL (ref 4.0–10.5)
nRBC: 0 % (ref 0.0–0.2)

## 2022-12-04 LAB — COMPREHENSIVE METABOLIC PANEL
ALT: 13 U/L (ref 0–44)
AST: 18 U/L (ref 15–41)
Albumin: 4 g/dL (ref 3.5–5.0)
Alkaline Phosphatase: 27 U/L — ABNORMAL LOW (ref 38–126)
Anion gap: 8 (ref 5–15)
BUN: 21 mg/dL (ref 8–23)
CO2: 26 mmol/L (ref 22–32)
Calcium: 9.2 mg/dL (ref 8.9–10.3)
Chloride: 107 mmol/L (ref 98–111)
Creatinine, Ser: 0.76 mg/dL (ref 0.61–1.24)
GFR, Estimated: >60 mL/min (ref >60)
Glucose, Bld: 89 mg/dL (ref 70–99)
Potassium: 3.6 mmol/L (ref 3.5–5.1)
Sodium: 141 mmol/L (ref 135–145)
Total Bilirubin: 1 mg/dL (ref 0.3–1.2)
Total Protein: 6.8 g/dL (ref 6.5–8.1)

## 2022-12-04 LAB — URINALYSIS, ROUTINE W REFLEX MICROSCOPIC
Bilirubin Urine: NEGATIVE
Glucose, UA: NEGATIVE mg/dL
Hgb urine dipstick: NEGATIVE
Ketones, ur: NEGATIVE mg/dL
Leukocytes,Ua: NEGATIVE
Nitrite: NEGATIVE
Protein, ur: NEGATIVE mg/dL
Specific Gravity, Urine: 1.015 (ref 1.005–1.030)
pH: 7 (ref 5.0–8.0)

## 2022-12-04 LAB — TYPE AND SCREEN
ABO/RH(D): A POS
Antibody Screen: NEGATIVE

## 2022-12-04 LAB — SURGICAL PCR SCREEN
MRSA, PCR: NEGATIVE
Staphylococcus aureus: NEGATIVE

## 2022-12-04 NOTE — Patient Instructions (Addendum)
Your procedure is scheduled on: 12/15/22 - TUESDAY Report to the Registration Desk on the 1st floor of the [facility]. To find out your arrival time, please call [phone] between 1PM - 3PM on: 12/14/22 - MONDAY If your arrival time is 6:00 am, do not arrive before that time as the [facility] entrance doors do not open until 6:00 am.  REMEMBER: Instructions that are not followed completely may result in serious medical risk, up to and including death; or upon the discretion of your surgeon and anesthesiologist your surgery may need to be rescheduled.  Do not eat food after midnight the night before surgery.  No gum chewing or hard candies.  You may however, drink CLEAR liquids up to 2 hours before you are scheduled to arrive for your surgery. Do not drink anything within 2 hours of your scheduled arrival time.  Clear liquids include: - water  - apple juice without pulp - gatorade (not RED colors) - black coffee or tea (Do NOT add milk or creamers to the coffee or tea) Do NOT drink anything that is not on this list.  In addition, your doctor has ordered for you to drink the provided:  Ensure Pre-Surgery Clear Carbohydrate Drink  Drinking this carbohydrate drink up to two hours before surgery helps to reduce insulin resistance and improve patient outcomes. Please complete drinking 2 hours before scheduled arrival time.  One week prior to surgery: 12/08/22: Stop Anti-inflammatories (NSAIDS) such as Advil, Aleve, Ibuprofen, Motrin, Naproxen, Naprosyn and Aspirin based products such as Excedrin, Goody's Powder, BC Powder.  Stop ON 12/08/22, ANY OVER THE COUNTER supplements until after surgery: Multiple Vitamin .  You may take Tylenol if needed for pain up until the day of surgery.   TAKE ONLY THESE MEDICATIONS THE MORNING OF SURGERY WITH A SIP OF WATER:  Choline Fenofibrate  metoprolol succinate (TOPROL-XL)    No Alcohol for 24 hours before  or after surgery.  No Smoking including e-cigarettes for 24 hours before surgery.  No chewable tobacco products for at least 6 hours before surgery.  No nicotine patches on the day of surgery.  Do not use any "recreational" drugs for at least a week (preferably 2 weeks) before your surgery.  Please be advised that the combination of cocaine and anesthesia may have negative outcomes, up to and including death. If you test positive for cocaine, your surgery will be cancelled.  On the morning of surgery brush your teeth with toothpaste and water, you may rinse your mouth with mouthwash if you wish. Do not swallow any toothpaste or mouthwash.  Use CHG Soap or wipes as directed on instruction sheet.  Do not wear jewelry, make-up, hairpins, clips or nail polish.  Do not wear lotions, powders, or perfumes.   Do not shave body hair from the neck down 48 hours before surgery.  Contact lenses, hearing aids and dentures may not be worn into surgery.  Do not bring valuables to the hospital. [facility] is not responsible for any missing/lost belongings or valuables.   Notify your doctor if there is any change in your medical condition (cold, fever, infection).  Wear comfortable clothing (specific to your surgery type) to the hospital.  After surgery, you can help prevent lung complications by doing breathing exercises.  Take deep breaths and cough every 1-2 hours. Your doctor may order a device called an Incentive Spirometer to help you take deep breaths. When coughing or sneezing, hold a pillow  firmly against your incision with both hands. This is called "splinting." Doing this helps protect your incision. It also decreases belly discomfort.  If you are being admitted to the hospital overnight, leave your suitcase in the car. After surgery it may be brought to your room.  In case of increased patient census, it may be necessary for you, the patient, to continue your postoperative care in the  Same Day Surgery department.  If you are being discharged the day of surgery, you will not be allowed to drive home. You will need a responsible individual to drive you home and stay with you for 24 hours after surgery.   If you are taking public transportation, you will need to have a responsible individual with you.  Please call the [place] Dept. at [phone] if you have any questions about these instructions.  Surgery Visitation Policy:  Patients undergoing a surgery or procedure may have two family members or support persons with them as long as the person is not COVID-19 positive or experiencing its symptoms.   Inpatient Visitation:    Visiting hours are 7 a.m. to 8 p.m. Up to four visitors are allowed at one time in a patient room. The visitors may rotate out with other people during the day. One designated support person (adult) may remain overnight.    Preparing for Surgery with CHLORHEXIDINE GLUCONATE (CHG) Soap  Chlorhexidine Gluconate (CHG) Soap  o An antiseptic cleaner that kills germs and bonds with the skin to continue killing germs even after washing  o Used for showering the night before surgery and morning of surgery  Before surgery, you can play an important role by reducing the number of germs on your skin.  CHG (Chlorhexidine gluconate) soap is an antiseptic cleanser which kills germs and bonds with the skin to continue killing germs even after washing.  Please do not use if you have an allergy to CHG or antibacterial soaps. If your skin becomes reddened/irritated stop using the CHG.  1. Shower the NIGHT BEFORE SURGERY and the MORNING OF SURGERY with CHG soap.  2. If you choose to wash your hair, wash your hair first as usual with your normal shampoo.  3. After shampooing, rinse your hair and body thoroughly to remove the shampoo.  4. Use CHG as you would any other liquid soap. You can apply CHG directly to the skin and wash gently with a  scrungie or a clean washcloth.  5. Apply the CHG soap to your body only from the neck down. Do not use on open wounds or open sores. Avoid contact with your eyes, ears, mouth, and genitals (private parts). Wash face and genitals (private parts) with your normal soap.  6. Wash thoroughly, paying special attention to the area where your surgery will be performed.  7. Thoroughly rinse your body with warm water.  8. Do not shower/wash with your normal soap after using and rinsing off the CHG soap.  9. Pat yourself dry with a clean towel.  10. Wear clean pajamas to bed the night before surgery.  12. Place clean sheets on your bed the night of your first shower and do not sleep with pets.  13. Shower again with the CHG soap on the day of surgery prior to arriving at the hospital.  14. Do not apply any deodorants/lotions/powders.  15. Please wear clean clothes to the hospital. How to Use an Incentive Spirometer  An incentive spirometer is a tool that measures how well you  are filling your lungs with each breath. Learning to take long, deep breaths using this tool can help you keep your lungs clear and active. This may help to reverse or lessen your chance of developing breathing (pulmonary) problems, especially infection. You may be asked to use a spirometer: After a surgery. If you have a lung problem or a history of smoking. After a long period of time when you have been unable to move or be active. If the spirometer includes an indicator to show the highest number that you have reached, your health care provider or respiratory therapist will help you set a goal. Keep a log of your progress as told by your health care provider. What are the risks? Breathing too quickly may cause dizziness or cause you to pass out. Take your time so you do not get dizzy or light-headed. If you are in pain, you may need to take pain medicine before doing incentive spirometry. It is harder to take a deep breath  if you are having pain. How to use your incentive spirometer  Sit up on the edge of your bed or on a chair. Hold the incentive spirometer so that it is in an upright position. Before you use the spirometer, breathe out normally. Place the mouthpiece in your mouth. Make sure your lips are closed tightly around it. Breathe in slowly and as deeply as you can through your mouth, causing the piston or the ball to rise toward the top of the chamber. Hold your breath for 3-5 seconds, or for as long as possible. If the spirometer includes a coach indicator, use this to guide you in breathing. Slow down your breathing if the indicator goes above the marked areas. Remove the mouthpiece from your mouth and breathe out normally. The piston or ball will return to the bottom of the chamber. Rest for a few seconds, then repeat the steps 10 or more times. Take your time and take a few normal breaths between deep breaths so that you do not get dizzy or light-headed. Do this every 1-2 hours when you are awake. If the spirometer includes a goal marker to show the highest number you have reached (best effort), use this as a goal to work toward during each repetition. After each set of 10 deep breaths, cough a few times. This will help to make sure that your lungs are clear. If you have an incision on your chest or abdomen from surgery, place a pillow or a rolled-up towel firmly against the incision when you cough. This can help to reduce pain while taking deep breaths and coughing. General tips When you are able to get out of bed: Walk around often. Continue to take deep breaths and cough in order to clear your lungs. Keep using the incentive spirometer until your health care provider says it is okay to stop using it. If you have been in the hospital, you may be told to keep using the spirometer at home. Contact a health care provider if: You are having difficulty using the spirometer. You have trouble using the  spirometer as often as instructed. Your pain medicine is not giving enough relief for you to use the spirometer as told. You have a fever. Get help right away if: You develop shortness of breath. You develop a cough with bloody mucus from the lungs. You have fluid or blood coming from an incision site after you cough. Summary An incentive spirometer is a tool that can help you learn  to take long, deep breaths to keep your lungs clear and active. You may be asked to use a spirometer after a surgery, if you have a lung problem or a history of smoking, or if you have been inactive for a long period of time. Use your incentive spirometer as instructed every 1-2 hours while you are awake. If you have an incision on your chest or abdomen, place a pillow or a rolled-up towel firmly against your incision when you cough. This will help to reduce pain. Get help right away if you have shortness of breath, you cough up bloody mucus, or blood comes from your incision when you cough. This information is not intended to replace advice given to you by your health care provider. Make sure you discuss any questions you have with your health care provider. Document Revised: 11/20/2019 Document Reviewed: 11/20/2019 Elsevier Patient Education  [place] INFORMATION  [email or web address].com/  How to use [facility] Therapy System?  YouTube   [email or web address]  OPERATING INSTRUCTIONS  Start the product With dry hands, connect the transformer to the electrical connection located on the top of the cooler. Next, plug the transformer into an appropriate electrical outlet. The unit will automatically start running at this point.  To stop the pump, disconnect electrical power.  Unplug to stop the product when not in use. Unplugging the Polar Care unit turns it off. Always unplug immediately after use. Never leave it plugged in while unattended. Remove pad.     FIRST ADD WATER TO FILL LINE, THEN ICE---Replace ice when existing ice is almost melted  1 Discuss Treatment with your [place] Practitioner and Use Only as Prescribed 2 Apply Insulation Barrier & Cold Therapy Pad 3 Check for Moisture 4 Inspect Skin Regularly  Tips and Trouble Shooting Usage Tips 1. Use cubed or chunked ice for optimal performance. 2. It is recommended to drain the Pad between uses. To drain the pad, hold the Pad upright with the hose pointed toward the ground. Depress the black plunger and allow water to drain out. 3. You may disconnect the Pad from the unit without removing the pad from the affected area by depressing the silver tabs on the hose coupling and gently pulling the hoses apart. The Pad and unit will seal itself and will not leak. Note: Some dripping during release is normal. 4. DO NOT RUN PUMP WITHOUT WATER! The pump in this unit is designed to run with water. Running the unit without water will cause permanent damage to the pump. 5. Unplug unit before removing lid.  TROUBLESHOOTING GUIDE Pump not running, Water not flowing to the pad, Pad is not getting cold 1. Make sure the transformer is plugged into the wall outlet. 2. Confirm that the ice and water are filled to the indicated levels. 3. Make sure there are no kinks in the pad. 4. Gently pull on the blue tube to make sure the tube/pad junction is straight. 5. Remove the pad from the treatment site and ll it while the pad is lying at; then reapply. 6. Confirm that the pad couplings are securely attached to the unit. Listen for the double clicks (Figure 1) to confirm the pad couplings are securely attached.  Leaks    Note: Some condensation on the lines, controller, and pads is unavoidable, especially in warmer climates. 1. If using a [place] Polar Care Cold Therapy unit with a detachable Cold Therapy Pad, and a leak exists (other than  condensation on the lines) disconnect the pad couplings. Make  sure the silver tabs on the couplings are depressed before reconnecting the pad to the pump hose; then confirm both sides of the coupling are properly clicked in. 2. If the coupling continues to leak or a leak is detected in the pad itself, stop using it and call [place] at [phone].  Cleaning After use, empty and dry the unit with a soft cloth. Warm water and mild detergent may be used occasionally to clean the pump and tubes.  WARNING: The [place] can be cold enough to cause serious injury, including full skin necrosis. Follow these Operating Instructions, and carefully read the Product Insert (see pouch on side of unit) and the Cold Therapy Pad Fitting Instructions (provided with each Cold Therapy Pad) prior to use.

## 2022-12-07 ENCOUNTER — Encounter: Payer: Self-pay | Admitting: Surgery

## 2022-12-07 NOTE — Progress Notes (Signed)
Perioperative / Anesthesia Services  Pre-Admission Testing Clinical Review / Preoperative Anesthesia Consult  Date: 12/09/22  Patient Demographics:  Name: Gregory [name] DOB:   [birth year] MRN:   [id number]  Planned Surgical Procedure(s):    Case: [id number] Date/Time: 12/15/22 0715   Procedure: TOTAL KNEE ARTHROPLASTY - RNFA (Left: Knee)   Anesthesia type: Choice   Pre-op diagnosis: PRIMARY OSTEOARTHRITIS OF LEFT KNEE.   Location: [facility] OR ROOM 03 / [place] ORS FOR ANESTHESIA GROUP   Surgeons: Corky Mull, MD     NOTE: Available PAT nursing documentation and vital signs have been reviewed. Clinical nursing staff has updated patient's PMH/PSHx, current medication list, and drug allergies/intolerances to ensure comprehensive history available to assist in medical decision making as it pertains to the aforementioned surgical procedure and anticipated anesthetic course. Extensive review of available clinical information personally performed. [place] PMH and PSHx updated with any diagnoses/procedures that  may have been inadvertently omitted during his intake with the pre-admission testing department's nursing staff.  Clinical Discussion:  Gregory [name] is a 89 y.o. male who is submitted for pre-surgical anesthesia review and clearance prior to him undergoing the above procedure. Patient has never been a smoker. Pertinent PMH includes: CAD (s/p CABG), MI, dilated cardiomyopathy, diastolic dysfunction, valvular heart disease, carotid stenosis, HTN, HLD, DOE, OA, lumbar DDD.   Patient is followed by cardiology Nehemiah Massed, MD). He was last seen in the cardiology clinic on 09/22/2022; notes reviewed. At the time of his clinic visit, patient doing well overall from a cardiovascular perspective. Patient denied any chest pain, shortness of breath, PND, orthopnea, palpitations, significant peripheral edema, weakness, fatigue, vertiginous symptoms, or presyncope/syncope. Patient with a past medical history  significant for cardiovascular diagnoses. Documented physical exam was grossly benign, providing no evidence of acute exacerbation and/or decompensation of the patient's known cardiovascular conditions.  Patient suffered an MI in 07/2005.  Diagnostic LEFT heart catheterization was performed on 08/05/2005 revealing multivessel CAD; 20% proximal LM, 60% proximal LAD, 80% mid LAD, 100% OM1, 40% proximal RCA, and 100% mid RCA.  Given the degree and complexity of patient's coronary artery disease, coronary anatomy was not felt to be amenable to PCI.  CVTS was consulted regarding revascularization procedure.    Patient underwent a four-vessel revascularization procedure on 08/07/2005.  LIMA-LAD, SVG-D1, SVG-OM1, and SVG-PDA bypass grafts were placed.  Most recent myocardial perfusion imaging study was performed on 12/10/2016 revealing a mildly reduced left ventricular systolic function with an EF of 48%.  There were no regional wall motion abnormalities.  SPECT images demonstrated a small perfusion abnormality in the inferior myocardium on stress images.  Resting images demonstrated a minimal fixed inferior myocardial perfusion defect consistent with possible previous infarct without evidence of myocardial ischemia.  Patient with a history of a nonischemic (dilated) cardiomyopathy.  Cardiac function has been serially monitored since diagnosis.  Most recent TTE was performed on 10/05/2019 revealing a low normal left ventricular systolic function with an EF of 50%.  There were no regional wall motion abnormalities. Left ventricular diastolic Doppler parameters consistent with abnormal relaxation (G1DD).  There was mild biatrial and mild right ventricular enlargement.  Trivial to mild pan valvular regurgitation noted. All transvalvular gradients were noted to be normal providing no evidence suggestive of valvular stenosis.  Blood pressure well controlled at 120/52 mmHg on currently prescribed ACEi (losartan) and  beta-blocker (metoprolol succinate) therapies.  Patient is on pravastatin for his HLD diagnosis and ASCVD prevention. Patient is not diabetic. He does not have  an OSAH diagnosis.  Functional capacity limited by patient's age and multiple medical comorbidities.  With that being said, patient still felt to be able to achieve at least 4 METS of activity without experiencing any significant degree of angina/anginal equivalent symptoms.  No changes were made to his medication regimen during his visit with cardiology.  Patient to follow-up with outpatient cardiology service in 6 months or sooner if needed.  Gregory [name] is scheduled for an elective LEFT TOTAL KNEE ARTHROPLASTY on 12/15/2022 with Dr. Milagros Evener, MD.  Given patient's past medical history significant for cardiovascular diagnoses, presurgical cardiac clearance was sought by the PAT team. Per cardiology, "this patient is optimized for surgery and may proceed with the planned procedural course with a LOW risk of significant perioperative cardiovascular complications".    In review of his medication reconciliation, it is noted that patient is currently on prescribed daily antiplatelet therapy. He has been instructed on recommendations for holding his low dose ASA for 3 days prior to his procedure with plans to restart as soon as postoperative bleeding risk felt to be minimized by his attending surgeon. The patient has been instructed that his last dose of his ASA should be on 12/11/2022.  Patient denies previous perioperative complications with anesthesia in the past. In review of the available records, it is noted that patient underwent a general anesthetic course here at [facility] ([facility]) in 10/2020 without documented complications.      12/04/2022    9:37 AM 12/04/2022    8:57 AM 11/20/2021   10:12 AM  Vitals with BMI  Weight  174 lbs 10 oz   Systolic [id number]  [id number]  Diastolic 73  65  Pulse  76 76     Providers/Specialists:   NOTE: Primary physician provider listed below. Patient may have been seen by APP or partner within same practice.   PROVIDER ROLE / SPECIALTY LAST OV  Poggi, Marshall Cork, MD Orthopedics (Surgeon) 12/04/2022  Sofie Hartigan, MD Primary Care Provider 09/08/2022  Serafina Royals, MD Cardiology 09/22/2022   Allergies:  Hydrocodone  Current Home Medications:   No current facility-administered medications for this encounter.    acetaminophen (TYLENOL) 500 MG tablet   aspirin EC 325 MG tablet   aspirin EC 81 MG tablet   Choline Fenofibrate (FENOFIBRIC ACID) 135 MG CPDR   lisinopril (ZESTRIL) 20 MG tablet   metoprolol succinate (TOPROL-XL) 25 MG 24 hr tablet   Multiple Vitamin (MULTIVITAMIN WITH MINERALS) TABS tablet   pravastatin (PRAVACHOL) 40 MG tablet   psyllium (METAMUCIL) 58.6 % powder   History:   Past Medical History:  Diagnosis Date   Arthritis    CAD (coronary artery disease) 08/05/2005   a.) LHC 08/05/2005: 20% pLM, 60% pLAD, 80% mLAD, 100% OM1, 40% pRCA, 100% mRCA -- CVTS consult; b.) 4v CABG 08/07/2005   Carotid stenosis    DDD (degenerative disc disease), lumbar    Diastolic dysfunction    a.) TTE 08/30/2012: EF 45%, lat HK, triv MR, G1DD; b.) TTE 12/10/2016: EF 40%, mild LAE, mild panvalvular regurg; c.) TTE 10/05/2019: EF 50%, mild BAE, mild RVE, triv AR/PR, mild MR/TR, G1DD   Dilated cardiomyopathy ([place])    a.) TTE 08/30/2012: EF 45%; b.) TTE 12/10/2016: EF 40%; c.) MPI 08/22/2019: EF 57%; d.) TTE 10/05/2019: EF 50%   DOE (dyspnea on exertion)    Headache    History of bilateral cataract extraction    Hyperlipidemia    Hypertension  Myocardial infarction ([place]) 07/2005   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   a.) LIMA-LAD, SVG-D1, SVG-OM1, SVG-PDA   Valvular heart disease    Past Surgical History:  Procedure Laterality Date   CARPOMETACARPAL ([place]) FUSION OF THUMB Left 10/23/2020   Procedure: SUSPENSION ARTHROPLASTY OF LEFT THUMB [place]  JOINT;  Surgeon: Corky Mull, MD;  Location: [facility] ORS;  Service: Orthopedics;  Laterality: Left;   CATARACT EXTRACTION Bilateral    CORONARY ARTERY BYPASS GRAFT N/A 08/07/2005   Procedure: CORONARY ARTERY BYPASS GRAFT; Location: [facility]; Surgeon: Marnee Guarneri, MD   ESOPHAGEAL DILATION     x2   KNEE ARTHROSCOPY     ROTATOR CUFF REPAIR Right    TONSILLECTOMY     No family history on file. Social History   Tobacco Use   Smoking status: Never   Smokeless tobacco: Never  Vaping Use   Vaping Use: Never used  Substance Use Topics   Alcohol use: No   Drug use: No    Pertinent Clinical Results:  LABS:   No visits with results within 3 Day(s) from this visit.  Latest known visit with results is:  Hospital Outpatient Visit on 12/04/2022  Component Date Value Ref Range Status   WBC 12/04/2022 5.1  4.0 - 10.5 K/uL Final   RBC 12/04/2022 4.63  4.22 - 5.81 MIL/uL Final   Hemoglobin 12/04/2022 13.8  13.0 - 17.0 g/dL Final   HCT 12/04/2022 41.5  39.0 - 52.0 % Final   MCV 12/04/2022 89.6  80.0 - 100.0 fL Final   MCH 12/04/2022 29.8  26.0 - 34.0 pg Final   MCHC 12/04/2022 33.3  30.0 - 36.0 g/dL Final   RDW 12/04/2022 13.8  11.5 - 15.5 % Final   Platelets 12/04/2022 239  150 - 400 K/uL Final   nRBC 12/04/2022 0.0  0.0 - 0.2 % Final   Neutrophils Relative % 12/04/2022 55  % Final   Neutro Abs 12/04/2022 2.9  1.7 - 7.7 K/uL Final   Lymphocytes Relative 12/04/2022 29  % Final   Lymphs Abs 12/04/2022 1.5  0.7 - 4.0 K/uL Final   Monocytes Relative 12/04/2022 6  % Final   Monocytes Absolute 12/04/2022 0.3  0.1 - 1.0 K/uL Final   Eosinophils Relative 12/04/2022 8  % Final   Eosinophils Absolute 12/04/2022 0.4  0.0 - 0.5 K/uL Final   Basophils Relative 12/04/2022 2  % Final   Basophils Absolute 12/04/2022 0.1  0.0 - 0.1 K/uL Final   Immature Granulocytes 12/04/2022 0  % Final   Abs Immature Granulocytes 12/04/2022 0.02  0.00 - 0.07 K/uL Final   Performed at [facility], [address] [place], [place] [address]   Sodium 12/04/2022 141  135 - 145 mmol/L Final   Potassium 12/04/2022 3.6  3.5 - 5.1 mmol/L Final   Chloride 12/04/2022 107  98 - 111 mmol/L Final   CO2 12/04/2022 26  22 - 32 mmol/L Final   Glucose, Bld 12/04/2022 89  70 - 99 mg/dL Final   Glucose reference range applies only to samples taken after fasting for at least 8 hours.   BUN 12/04/2022 21  8 - 23 mg/dL Final   Creatinine, Ser 12/04/2022 0.76  0.61 - 1.24 mg/dL Final   Calcium 12/04/2022 9.2  8.9 - 10.3 mg/dL Final   Total Protein 12/04/2022 6.8  6.5 - 8.1 g/dL Final   Albumin 12/04/2022 4.0  3.5 - 5.0 g/dL Final   AST 12/04/2022 18  15 -  41 U/L Final   ALT 12/04/2022 13  0 - 44 U/L Final   Alkaline Phosphatase 12/04/2022 27 (L)  38 - 126 U/L Final   Total Bilirubin 12/04/2022 1.0  0.3 - 1.2 mg/dL Final   GFR, Estimated 12/04/2022 >60  >60 mL/min Final   Comment: (NOTE) Calculated using the CKD-EPI Creatinine Equation (2021)    Anion gap 12/04/2022 8  5 - 15 Final   Performed at [facility], [place], [place] [address]   Color, Urine 12/04/2022 YELLOW (A)  YELLOW Final   APPearance 12/04/2022 CLOUDY (A)  CLEAR Final   Specific Gravity, Urine 12/04/2022 1.015  1.005 - 1.030 Final   pH 12/04/2022 7.0  5.0 - 8.0 Final   Glucose, UA 12/04/2022 NEGATIVE  NEGATIVE mg/dL Final   Hgb urine dipstick 12/04/2022 NEGATIVE  NEGATIVE Final   Bilirubin Urine 12/04/2022 NEGATIVE  NEGATIVE Final   Ketones, ur 12/04/2022 NEGATIVE  NEGATIVE mg/dL Final   Protein, ur 12/04/2022 NEGATIVE  NEGATIVE mg/dL Final   Nitrite 12/04/2022 NEGATIVE  NEGATIVE Final   Leukocytes,Ua 12/04/2022 NEGATIVE  NEGATIVE Final   Performed at [facility], [address] [place], [place] [address]   ABO/RH(D) 12/04/2022 A POS   Final   Antibody Screen 12/04/2022 NEG   Final   Sample Expiration 12/04/2022 12/18/2022,2359   Final   Extend sample reason 12/04/2022    Final                    Value:NO TRANSFUSIONS OR PREGNANCY IN THE PAST 3 MONTHS Performed at [facility], [address] [place], [place] [address]    MRSA, PCR 12/04/2022 NEGATIVE  NEGATIVE Final   Staphylococcus aureus 12/04/2022 NEGATIVE  NEGATIVE Final   Comment: (NOTE) The Xpert SA Assay (FDA approved for NASAL specimens in patients 43 years of age and older), is one component of a comprehensive surveillance program. It is not intended to diagnose infection nor to guide or monitor treatment. Performed at [facility], [address] [place], [place] [address]     ECG: Date: 09/22/2022 Time ECG obtained: 1125 AM Rate: 74 bpm Rhythm: normal sinus; IRBBB Axis (leads I and aVF): Normal Intervals: PR 172 ms. QRS 100 ms. QTc 468 ms. ST segment and T wave changes: No evidence of acute ST segment elevation or depression. Evidence of an age undetermined inferior infarct present.  Comparison: Similar to previous tracing obtained on 08/26/2021 NOTE: Tracing obtained at [facility]; unable for review. Above based on cardiologist's interpretation.    IMAGING / PROCEDURES: DIAGNOSTIC RADIOGRAPHS OF LEFT KNEE 3 VIEWS performed on 06/24/2022 Severe degenerative changes, primarily involving the medial compartment with 100% medial joint space narrowing.   Minimal degenerative changes of the lateral and patellofemoral compartments also are noted.   Overall alignment is in moderate varus.   Incidental note is made of a well-positioned partial medial knee replacement involving the right knee which shows no evidence for loosening.   No fractures, lytic lesions, or abnormal calcifications are noted.   TRANSTHORACIC ECHOCARDIOGRAM performed on 10/05/2019 LVEF 45-50% Normal left ventricular systolic function Normal right ventricular systolic function Mild to moderate TR and MR Trace AR; trivial PR No valvular stenosis No evidence of pericardial effusion   MYOCARDIAL PERFUSION IMAGING STUDY  (LEXISCAN) performed on 08/28/2019 LVEF 57% Regional wall motion reveals normal myocardial thickening and wall motion Minimal basal reversible and fixed inferior myocardial perfusion defect consistent with small area of possible infarct and/or  ischemia No artifacts noted No stress-induced arrhythmias Left ventricular cavity size normal The overall quality of the study is good  Impression and Plan:  Gregory [name] has been referred for pre-anesthesia review and clearance prior to him undergoing the planned anesthetic and procedural courses. Available labs, pertinent testing, and imaging results were personally reviewed by me in preparation for upcoming operative/procedural course. [facility] medical record has been updated following extensive record review and patient interview with PAT staff.   This patient has been appropriately cleared by cardiology with an overall LOW risk of significant perioperative cardiovascular complications. Based on clinical review performed today (12/09/22), barring any significant acute changes in the patient's overall condition, it is anticipated that he will be able to proceed with the planned surgical intervention. Any acute changes in clinical condition may necessitate his procedure being postponed and/or cancelled. Patient will meet with anesthesia team (MD and/or CRNA) on the day of his procedure for preoperative evaluation/assessment. Questions regarding anesthetic course will be fielded at that time.   Pre-surgical instructions were reviewed with the patient during his PAT appointment, and questions were fielded to satisfaction by PAT clinical staff. He has been instructed on which medications that he will need to hold prior to surgery, as well as the ones that have been deemed safe/appropriate to take of the day of his procedure. As part of the general education provided by PAT, patient made aware both verbally and in writing, that he would need to abstain from the  use of any illegal substances during his perioperative course.  He was advised that failure to follow the provided instructions could necessitate case cancellation or result serious perioperative complications up to and including death. Patient encouraged to contact PAT and/or his surgeon's office to discuss any questions or concerns that may arise prior to surgery; verbalized understanding.   Honor Loh, MSN, APRN, FNP-C, CEN [facility]  Peri-operative Services Nurse Practitioner Phone: [phone] Fax: [phone] 12/09/22 2:06 PM  NOTE: This note has been prepared using Dragon dictation software. Despite my best ability to proofread, there is always the potential that unintentional transcriptional errors may still occur from this process.

## 2022-12-09 ENCOUNTER — Encounter: Payer: Self-pay | Admitting: Surgery

## 2022-12-09 ENCOUNTER — Ambulatory Visit
Admission: RE | Admit: 2022-12-09 | Discharge: 2022-12-09 | Disposition: A | Discharge status: Home / Self Care | Payer: Medicare Other | Source: Ambulatory Visit | Attending: Surgery | Admitting: Surgery

## 2022-12-09 DIAGNOSIS — M1712 Unilateral primary osteoarthritis, left knee: Secondary | ICD-10-CM | POA: Insufficient documentation

## 2022-12-15 ENCOUNTER — Ambulatory Visit: Payer: Medicare Other | Admitting: Urgent Care

## 2022-12-15 ENCOUNTER — Inpatient Hospital Stay
Admission: RE | Admit: 2022-12-15 | Discharge: 2022-12-23 | DRG: 470 | Disposition: A | Discharge status: Home Health | Payer: Medicare Other | Attending: Surgery | Admitting: Surgery

## 2022-12-15 ENCOUNTER — Encounter
Admission: RE | Disposition: A | Discharge status: Home Health | Payer: Self-pay | Source: Home / Self Care | Attending: Surgery

## 2022-12-15 ENCOUNTER — Other Ambulatory Visit: Payer: Self-pay

## 2022-12-15 ENCOUNTER — Ambulatory Visit: Payer: Medicare Other

## 2022-12-15 ENCOUNTER — Encounter: Payer: Self-pay | Admitting: Surgery

## 2022-12-15 DIAGNOSIS — Z7982 Long term (current) use of aspirin: Secondary | ICD-10-CM

## 2022-12-15 DIAGNOSIS — M1712 Unilateral primary osteoarthritis, left knee: Principal | ICD-10-CM | POA: Diagnosis present

## 2022-12-15 DIAGNOSIS — Z96651 Presence of right artificial knee joint: Secondary | ICD-10-CM | POA: Diagnosis present

## 2022-12-15 DIAGNOSIS — Z811 Family history of alcohol abuse and dependence: Secondary | ICD-10-CM

## 2022-12-15 DIAGNOSIS — I2510 Atherosclerotic heart disease of native coronary artery without angina pectoris: Secondary | ICD-10-CM | POA: Diagnosis present

## 2022-12-15 DIAGNOSIS — Z79899 Other long term (current) drug therapy: Secondary | ICD-10-CM

## 2022-12-15 DIAGNOSIS — Z96652 Presence of left artificial knee joint: Secondary | ICD-10-CM

## 2022-12-15 DIAGNOSIS — Z800 Family history of malignant neoplasm of digestive organs: Secondary | ICD-10-CM

## 2022-12-15 DIAGNOSIS — I420 Dilated cardiomyopathy: Secondary | ICD-10-CM | POA: Diagnosis present

## 2022-12-15 DIAGNOSIS — Z96659 Presence of unspecified artificial knee joint: Secondary | ICD-10-CM

## 2022-12-15 DIAGNOSIS — I252 Old myocardial infarction: Secondary | ICD-10-CM

## 2022-12-15 DIAGNOSIS — I10 Essential (primary) hypertension: Secondary | ICD-10-CM | POA: Diagnosis present

## 2022-12-15 DIAGNOSIS — E782 Mixed hyperlipidemia: Secondary | ICD-10-CM | POA: Diagnosis present

## 2022-12-15 DIAGNOSIS — Z8249 Family history of ischemic heart disease and other diseases of the circulatory system: Secondary | ICD-10-CM

## 2022-12-15 DIAGNOSIS — Z951 Presence of aortocoronary bypass graft: Secondary | ICD-10-CM

## 2022-12-15 HISTORY — DX: Cataract extraction status, right eye: Z98.42

## 2022-12-15 HISTORY — DX: Other ill-defined heart diseases: I51.89

## 2022-12-15 HISTORY — PX: TOTAL KNEE ARTHROPLASTY: SHX125

## 2022-12-15 HISTORY — DX: Cataract extraction status, left eye: Z98.41

## 2022-12-15 LAB — ABO/RH: ABO/RH(D): A POS

## 2022-12-15 SURGERY — ARTHROPLASTY, KNEE, TOTAL
Anesthesia: Spinal | Site: Knee | Laterality: Left

## 2022-12-15 MED ORDER — FAMOTIDINE 20 MG PO TABS
ORAL_TABLET | ORAL | Status: AC
  Administered 2022-12-15: 20 mg via ORAL
  Filled 2022-12-15: qty 1

## 2022-12-15 MED ORDER — CEFAZOLIN SODIUM-DEXTROSE 2-4 GM/100ML-% IV SOLN
INTRAVENOUS | Status: AC
  Filled 2022-12-15: qty 100

## 2022-12-15 MED ORDER — FENTANYL CITRATE (PF) 100 MCG/2ML IJ SOLN
INTRAMUSCULAR | Status: AC
  Filled 2022-12-15: qty 2

## 2022-12-15 MED ORDER — METOPROLOL SUCCINATE ER 25 MG PO TB24
12.5000 mg | ORAL_TABLET | Freq: Every day | ORAL | Status: DC
  Administered 2022-12-16 – 2022-12-23 (×7): 12.5 mg via ORAL
  Filled 2022-12-15 (×3): qty 1
  Filled 2022-12-15: qty 0.5
  Filled 2022-12-15 (×3): qty 1

## 2022-12-15 MED ORDER — ADULT MULTIVITAMIN W/MINERALS CH
1.0000 | ORAL_TABLET | Freq: Every day | ORAL | Status: DC
  Administered 2022-12-16 – 2022-12-23 (×7): 1 via ORAL
  Filled 2022-12-15 (×6): qty 1

## 2022-12-15 MED ORDER — GLYCOPYRROLATE 0.2 MG/ML IJ SOLN
INTRAMUSCULAR | Status: DC | PRN
  Administered 2022-12-15: .2 mg via INTRAVENOUS

## 2022-12-15 MED ORDER — ACETAMINOPHEN 325 MG PO TABS
325.0000 mg | ORAL_TABLET | Freq: Four times a day (QID) | ORAL | Status: DC | PRN
  Administered 2022-12-17 – 2022-12-20 (×2): 650 mg via ORAL
  Filled 2022-12-15 (×2): qty 2

## 2022-12-15 MED ORDER — ACETAMINOPHEN 500 MG PO TABS
ORAL_TABLET | ORAL | Status: AC
  Administered 2022-12-15: 1000 mg via ORAL
  Filled 2022-12-15: qty 2

## 2022-12-15 MED ORDER — SODIUM CHLORIDE FLUSH 0.9 % IV SOLN
INTRAVENOUS | Status: AC
  Filled 2022-12-15: qty 40

## 2022-12-15 MED ORDER — PRAVASTATIN SODIUM 20 MG PO TABS
80.0000 mg | ORAL_TABLET | Freq: Every evening | ORAL | Status: DC
  Administered 2022-12-18 – 2022-12-22 (×5): 80 mg via ORAL
  Filled 2022-12-15 (×6): qty 4

## 2022-12-15 MED ORDER — OXYCODONE HCL 5 MG PO TABS
5.0000 mg | ORAL_TABLET | ORAL | Status: DC | PRN
  Administered 2022-12-18 – 2022-12-22 (×3): 5 mg via ORAL
  Filled 2022-12-15 (×3): qty 1

## 2022-12-15 MED ORDER — MIDAZOLAM HCL 2 MG/2ML IJ SOLN
INTRAMUSCULAR | Status: AC
  Filled 2022-12-15: qty 2

## 2022-12-15 MED ORDER — CHLORHEXIDINE GLUCONATE 0.12 % MT SOLN: 15.0000 mL | Freq: Once | OROMUCOSAL | Status: AC

## 2022-12-15 MED ORDER — ACETAMINOPHEN 500 MG PO TABS
ORAL_TABLET | ORAL | Status: AC
  Administered 2022-12-16: 1000 mg via ORAL
  Filled 2022-12-15: qty 2

## 2022-12-15 MED ORDER — SODIUM CHLORIDE 0.9 % IV SOLN: INTRAVENOUS | Status: DC

## 2022-12-15 MED ORDER — EPHEDRINE SULFATE (PRESSORS) 50 MG/ML IJ SOLN
INTRAMUSCULAR | Status: DC | PRN
  Administered 2022-12-15: 10 mg via INTRAVENOUS

## 2022-12-15 MED ORDER — METOCLOPRAMIDE HCL 5 MG/ML IJ SOLN: 5.0000 mg | Freq: Three times a day (TID) | INTRAMUSCULAR | Status: DC | PRN

## 2022-12-15 MED ORDER — DIPHENHYDRAMINE HCL 12.5 MG/5ML PO ELIX
12.5000 mg | ORAL_SOLUTION | ORAL | Status: DC | PRN
  Filled 2022-12-15: qty 10

## 2022-12-15 MED ORDER — OXYCODONE HCL 5 MG/5ML PO SOLN: 5.0000 mg | Freq: Once | ORAL | Status: DC | PRN

## 2022-12-15 MED ORDER — BISACODYL 10 MG RE SUPP
10.0000 mg | Freq: Every day | RECTAL | Status: DC | PRN
  Administered 2022-12-18 – 2022-12-21 (×2): 10 mg via RECTAL
  Filled 2022-12-15 (×2): qty 1

## 2022-12-15 MED ORDER — TRANEXAMIC ACID 1000 MG/10ML IV SOLN
INTRAVENOUS | Status: DC | PRN
  Administered 2022-12-15: 1000 mg via TOPICAL

## 2022-12-15 MED ORDER — BUPIVACAINE HCL (PF) 0.5 % IJ SOLN
INTRAMUSCULAR | Status: DC | PRN
  Administered 2022-12-15: 2.8 mL

## 2022-12-15 MED ORDER — 0.9 % SODIUM CHLORIDE (POUR BTL) OPTIME
TOPICAL | Status: DC | PRN
  Administered 2022-12-15: 500 mL

## 2022-12-15 MED ORDER — DOCUSATE SODIUM 100 MG PO CAPS
100.0000 mg | ORAL_CAPSULE | Freq: Two times a day (BID) | ORAL | Status: DC
  Administered 2022-12-17 – 2022-12-23 (×12): 100 mg via ORAL
  Filled 2022-12-15 (×13): qty 1

## 2022-12-15 MED ORDER — DOCUSATE SODIUM 100 MG PO CAPS
ORAL_CAPSULE | ORAL | Status: AC
  Filled 2022-12-15: qty 1

## 2022-12-15 MED ORDER — BUPIVACAINE LIPOSOME 1.3 % IJ SUSP
INTRAMUSCULAR | Status: AC
  Filled 2022-12-15: qty 20

## 2022-12-15 MED ORDER — CEFAZOLIN SODIUM-DEXTROSE 2-4 GM/100ML-% IV SOLN
INTRAVENOUS | Status: AC
  Administered 2022-12-15: 2 g via INTRAVENOUS
  Filled 2022-12-15: qty 100

## 2022-12-15 MED ORDER — TRAMADOL HCL 50 MG PO TABS: 50.0000 mg | ORAL_TABLET | Freq: Four times a day (QID) | ORAL | Status: DC | PRN

## 2022-12-15 MED ORDER — FAMOTIDINE 20 MG PO TABS: 20.0000 mg | ORAL_TABLET | Freq: Once | ORAL | Status: AC

## 2022-12-15 MED ORDER — PRAVASTATIN SODIUM 40 MG PO TABS
ORAL_TABLET | ORAL | Status: AC
  Administered 2022-12-15: 80 mg via ORAL
  Filled 2022-12-15: qty 2

## 2022-12-15 MED ORDER — ACETAMINOPHEN 10 MG/ML IV SOLN: 1000.0000 mg | Freq: Once | INTRAVENOUS | Status: DC | PRN

## 2022-12-15 MED ORDER — MAGNESIUM HYDROXIDE 400 MG/5ML PO SUSP
30.0000 mL | Freq: Every day | ORAL | Status: DC | PRN
  Administered 2022-12-18: 30 mL via ORAL
  Filled 2022-12-15: qty 30

## 2022-12-15 MED ORDER — PROPOFOL 500 MG/50ML IV EMUL
INTRAVENOUS | Status: DC | PRN
  Administered 2022-12-15: 125 ug/kg/min via INTRAVENOUS

## 2022-12-15 MED ORDER — FENTANYL CITRATE (PF) 100 MCG/2ML IJ SOLN: 25.0000 ug | INTRAMUSCULAR | Status: DC | PRN

## 2022-12-15 MED ORDER — DEXAMETHASONE SODIUM PHOSPHATE 10 MG/ML IJ SOLN
INTRAMUSCULAR | Status: DC | PRN
  Administered 2022-12-15: 10 mg via INTRAVENOUS

## 2022-12-15 MED ORDER — TRIAMCINOLONE ACETONIDE 40 MG/ML IJ SUSP
INTRAMUSCULAR | Status: DC | PRN
  Administered 2022-12-15: 93 mL via INTRAMUSCULAR

## 2022-12-15 MED ORDER — TRIAMCINOLONE ACETONIDE 40 MG/ML IJ SUSP
INTRAMUSCULAR | Status: AC
  Filled 2022-12-15: qty 1

## 2022-12-15 MED ORDER — CEFAZOLIN SODIUM-DEXTROSE 2-4 GM/100ML-% IV SOLN
2.0000 g | INTRAVENOUS | Status: AC
  Administered 2022-12-15: 2 g via INTRAVENOUS

## 2022-12-15 MED ORDER — DOCUSATE SODIUM 100 MG PO CAPS
ORAL_CAPSULE | ORAL | Status: AC
  Administered 2022-12-15: 100 mg via ORAL
  Filled 2022-12-15: qty 1

## 2022-12-15 MED ORDER — METOCLOPRAMIDE HCL 10 MG PO TABS: 5.0000 mg | ORAL_TABLET | Freq: Three times a day (TID) | ORAL | Status: DC | PRN

## 2022-12-15 MED ORDER — FENTANYL CITRATE (PF) 100 MCG/2ML IJ SOLN
INTRAMUSCULAR | Status: DC | PRN
  Administered 2022-12-15: 25 ug via INTRAVENOUS
  Administered 2022-12-15: 50 ug via INTRAVENOUS
  Administered 2022-12-15: 25 ug via INTRAVENOUS

## 2022-12-15 MED ORDER — EPHEDRINE 5 MG/ML INJ
INTRAVENOUS | Status: AC
  Filled 2022-12-15: qty 5

## 2022-12-15 MED ORDER — PSYLLIUM 95 % PO PACK
1.0000 | PACK | Freq: Every day | ORAL | Status: DC
  Administered 2022-12-16 – 2022-12-23 (×7): 1 via ORAL
  Filled 2022-12-15 (×8): qty 1

## 2022-12-15 MED ORDER — TRANEXAMIC ACID 1000 MG/10ML IV SOLN
INTRAVENOUS | Status: AC
  Filled 2022-12-15: qty 10

## 2022-12-15 MED ORDER — PROMETHAZINE HCL 25 MG/ML IJ SOLN: 6.2500 mg | INTRAMUSCULAR | Status: DC | PRN

## 2022-12-15 MED ORDER — FLEET ENEMA 7-19 GM/118ML RE ENEM
1.0000 | ENEMA | Freq: Once | RECTAL | Status: AC | PRN
  Administered 2022-12-18: 1 via RECTAL

## 2022-12-15 MED ORDER — LACTATED RINGERS IV SOLN: INTRAVENOUS | Status: DC

## 2022-12-15 MED ORDER — CEFAZOLIN SODIUM-DEXTROSE 2-4 GM/100ML-% IV SOLN
2.0000 g | Freq: Four times a day (QID) | INTRAVENOUS | Status: AC
  Administered 2022-12-15 – 2022-12-16 (×2): 2 g via INTRAVENOUS

## 2022-12-15 MED ORDER — ORAL CARE MOUTH RINSE: 15.0000 mL | Freq: Once | OROMUCOSAL | Status: AC

## 2022-12-15 MED ORDER — ONDANSETRON HCL 4 MG PO TABS: 4.0000 mg | ORAL_TABLET | Freq: Four times a day (QID) | ORAL | Status: DC | PRN

## 2022-12-15 MED ORDER — HYDROMORPHONE HCL 1 MG/ML IJ SOLN
0.2500 mg | INTRAMUSCULAR | Status: DC | PRN
  Administered 2022-12-22: 0.5 mg via INTRAVENOUS
  Filled 2022-12-15: qty 1

## 2022-12-15 MED ORDER — BUPIVACAINE HCL (PF) 0.5 % IJ SOLN
INTRAMUSCULAR | Status: AC
  Filled 2022-12-15: qty 30

## 2022-12-15 MED ORDER — APIXABAN 2.5 MG PO TABS
2.5000 mg | ORAL_TABLET | Freq: Two times a day (BID) | ORAL | Status: DC
  Administered 2022-12-16 – 2022-12-23 (×13): 2.5 mg via ORAL
  Filled 2022-12-15 (×14): qty 1

## 2022-12-15 MED ORDER — LISINOPRIL 20 MG PO TABS
20.0000 mg | ORAL_TABLET | ORAL | Status: DC
  Administered 2022-12-18 – 2022-12-23 (×6): 20 mg via ORAL
  Filled 2022-12-15 (×6): qty 1

## 2022-12-15 MED ORDER — PHENYLEPHRINE HCL-NACL 20-0.9 MG/250ML-% IV SOLN
INTRAVENOUS | Status: DC | PRN
  Administered 2022-12-15: 15 ug/min via INTRAVENOUS

## 2022-12-15 MED ORDER — ONDANSETRON HCL 4 MG/2ML IJ SOLN: 4.0000 mg | Freq: Four times a day (QID) | INTRAMUSCULAR | Status: DC | PRN

## 2022-12-15 MED ORDER — FENOFIBRATE 160 MG PO TABS
160.0000 mg | ORAL_TABLET | ORAL | Status: DC
  Administered 2022-12-16 – 2022-12-23 (×7): 160 mg via ORAL
  Filled 2022-12-15 (×8): qty 1

## 2022-12-15 MED ORDER — CHLORHEXIDINE GLUCONATE 0.12 % MT SOLN
OROMUCOSAL | Status: AC
  Administered 2022-12-15: 15 mL via OROMUCOSAL
  Filled 2022-12-15: qty 15

## 2022-12-15 MED ORDER — EPINEPHRINE PF 1 MG/ML IJ SOLN
INTRAMUSCULAR | Status: AC
  Filled 2022-12-15: qty 1

## 2022-12-15 MED ORDER — ACETAMINOPHEN 500 MG PO TABS: 1000.0000 mg | ORAL_TABLET | Freq: Four times a day (QID) | ORAL | Status: AC

## 2022-12-15 MED ORDER — ONDANSETRON HCL 4 MG/2ML IJ SOLN
INTRAMUSCULAR | Status: DC | PRN
  Administered 2022-12-15: 4 mg via INTRAVENOUS

## 2022-12-15 MED ORDER — OXYCODONE HCL 5 MG PO TABS: 5.0000 mg | ORAL_TABLET | Freq: Once | ORAL | Status: DC | PRN

## 2022-12-15 MED ORDER — DROPERIDOL 2.5 MG/ML IJ SOLN: 0.6250 mg | Freq: Once | INTRAMUSCULAR | Status: DC | PRN

## 2022-12-15 MED ORDER — SODIUM CHLORIDE 0.9 % IR SOLN
Status: DC | PRN
  Administered 2022-12-15: 3000 mL

## 2022-12-15 SURGICAL SUPPLY — 65 items
APL PRP STRL LF DISP 70% ISPRP (MISCELLANEOUS) ×2
BIT DRILL QUICK REL 1/8 2PK SL (DRILL) IMPLANT
BLADE SAW SAG 25X90X1.19 (BLADE) ×1 IMPLANT
BLADE SURG SZ20 CARB STEEL (BLADE) ×1 IMPLANT
BNDG CMPR STD VLCR NS LF 5.8X6 (GAUZE/BANDAGES/DRESSINGS) ×1
BNDG ELASTIC 6X5.8 VLCR NS LF (GAUZE/BANDAGES/DRESSINGS) ×1 IMPLANT
BRNG TIB 0D 75X10 ANT STAB (Insert) ×1 IMPLANT
CEMENT BONE R 1X40 (Cement) ×2 IMPLANT
CEMENT VACUUM MIXING SYSTEM (MISCELLANEOUS) ×1 IMPLANT
CHLORAPREP W/TINT 26 (MISCELLANEOUS) ×1 IMPLANT
COOLER POLAR GLACIER W/PUMP (MISCELLANEOUS) ×1 IMPLANT
COVER MAYO STAND REUSABLE (DRAPES) ×1 IMPLANT
CUFF TOURN SGL QUICK 24 (TOURNIQUET CUFF)
CUFF TOURN SGL QUICK 34 (TOURNIQUET CUFF)
CUFF TRNQT CYL 24X4X16.5-23 (TOURNIQUET CUFF) IMPLANT
CUFF TRNQT CYL 34X4.125X (TOURNIQUET CUFF) IMPLANT
DRAPE 3/4 80X56 (DRAPES) ×1 IMPLANT
DRAPE IMP U-DRAPE 54X76 (DRAPES) ×1 IMPLANT
DRAPE U-SHAPE 47X51 STRL (DRAPES) ×1 IMPLANT
DRILL QUICK RELEASE 1/8 INCH (DRILL) ×3
DRSG MEPILEX SACRM 8.7X9.8 (GAUZE/BANDAGES/DRESSINGS) IMPLANT
DRSG OPSITE POSTOP 4X10 (GAUZE/BANDAGES/DRESSINGS) ×1 IMPLANT
DRSG OPSITE POSTOP 4X8 (GAUZE/BANDAGES/DRESSINGS) ×1 IMPLANT
ELECT REM PT RETURN 9FT ADLT (ELECTROSURGICAL) ×1
ELECTRODE REM PT RTRN 9FT ADLT (ELECTROSURGICAL) ×1 IMPLANT
FEMORAL CR LEFT  70MM (Joint) ×1 IMPLANT
FEMORAL CR LEFT 70MM (Joint) IMPLANT
GAUZE XEROFORM 1X8 LF (GAUZE/BANDAGES/DRESSINGS) ×1 IMPLANT
GLOVE BIO SURGEON STRL SZ7.5 (GLOVE) ×4 IMPLANT
GLOVE BIO SURGEON STRL SZ8 (GLOVE) ×4 IMPLANT
GLOVE BIOGEL PI IND STRL 8 (GLOVE) ×1 IMPLANT
GLOVE INDICATOR 8.0 STRL GRN (GLOVE) ×1 IMPLANT
GOWN STRL REUS W/ TWL LRG LVL3 (GOWN DISPOSABLE) ×1 IMPLANT
GOWN STRL REUS W/ TWL XL LVL3 (GOWN DISPOSABLE) ×1 IMPLANT
GOWN STRL REUS W/TWL LRG LVL3 (GOWN DISPOSABLE) ×1
GOWN STRL REUS W/TWL XL LVL3 (GOWN DISPOSABLE) ×1
HANDLE YANKAUER SUCT OPEN TIP (MISCELLANEOUS) ×1 IMPLANT
HOOD PEEL AWAY T7 (MISCELLANEOUS) ×3 IMPLANT
IMPL PATELLA POST 37X8.5 (Miscellaneous) IMPLANT
IMPLANT PATELLA POST 37X8.5 (Miscellaneous) ×1 IMPLANT
INSERT TIB BEARING 75 10 (Insert) IMPLANT
IV NS IRRIG 3000ML ARTHROMATIC (IV SOLUTION) ×1 IMPLANT
KIT TURNOVER KIT A (KITS) ×1 IMPLANT
MANIFOLD NEPTUNE II (INSTRUMENTS) ×1 IMPLANT
NDL SPNL 20GX3.5 QUINCKE YW (NEEDLE) ×1 IMPLANT
NEEDLE SPNL 20GX3.5 QUINCKE YW (NEEDLE) ×1 IMPLANT
NS IRRIG 1000ML POUR BTL (IV SOLUTION) ×1 IMPLANT
PACK TOTAL KNEE (MISCELLANEOUS) ×1 IMPLANT
PAD WRAPON POLAR KNEE (MISCELLANEOUS) ×1 IMPLANT
PENCIL SMOKE EVACUATOR (MISCELLANEOUS) ×1 IMPLANT
PLATE KNEE TIBIAL 75MM FIXED (Plate) IMPLANT
PULSAVAC PLUS IRRIG FAN TIP (DISPOSABLE) ×1
STAPLER SKIN PROX 35W (STAPLE) ×1 IMPLANT
SUCTION FRAZIER HANDLE 10FR (MISCELLANEOUS) ×1
SUCTION TUBE FRAZIER 10FR DISP (MISCELLANEOUS) ×1 IMPLANT
SUT VIC AB 0 CT1 36 (SUTURE) ×3 IMPLANT
SUT VIC AB 2-0 CT1 27 (SUTURE) ×3
SUT VIC AB 2-0 CT1 TAPERPNT 27 (SUTURE) ×3 IMPLANT
SYR 10ML LL (SYRINGE) ×1 IMPLANT
SYR 20ML LL LF (SYRINGE) ×1 IMPLANT
SYR 30ML LL (SYRINGE) IMPLANT
TIP FAN IRRIG PULSAVAC PLUS (DISPOSABLE) ×1 IMPLANT
TRAP FLUID SMOKE EVACUATOR (MISCELLANEOUS) ×2 IMPLANT
WATER STERILE IRR 500ML POUR (IV SOLUTION) ×1 IMPLANT
WRAPON POLAR PAD KNEE (MISCELLANEOUS) ×1

## 2022-12-15 NOTE — Anesthesia Preprocedure Evaluation (Signed)
Anesthesia Evaluation  Patient identified by MRN, date of birth, ID band Patient awake    Reviewed: Allergy & Precautions, H&P , NPO status , Patient's Chart, lab work & pertinent test results, reviewed documented beta blocker date and time   Airway Mallampati: II   Neck ROM: full    Dental  (+) Poor Dentition   Pulmonary neg pulmonary ROS   Pulmonary exam normal        Cardiovascular Exercise Tolerance: Good hypertension, On Medications + CAD, + Past MI and + DOE  Normal cardiovascular exam Rhythm:regular Rate:Normal     Neuro/Psych  Headaches  negative psych ROS   GI/Hepatic negative GI ROS, Neg liver ROS,,,  Endo/Other  negative endocrine ROS    Renal/GU negative Renal ROS  negative genitourinary   Musculoskeletal   Abdominal   Peds  Hematology negative hematology ROS (+)   Anesthesia Other Findings Past Medical History: No date: Arthritis 08/05/2005: CAD (coronary artery disease)     Comment:  a.) LHC 08/05/2005: 20% pLM, 60% pLAD, 80% mLAD, 100%               OM1, 40% pRCA, 100% mRCA -- CVTS consult; b.) 4v CABG               08/07/2005 No date: Carotid stenosis No date: DDD (degenerative disc disease), lumbar No date: Diastolic dysfunction     Comment:  a.) TTE 08/30/2012: EF 45%, lat HK, triv MR, G1DD; b.)               TTE 12/10/2016: EF 40%, mild LAE, mild panvalvular               regurg; c.) TTE 10/05/2019: EF 50%, mild BAE, mild RVE,               triv AR/PR, mild MR/TR, G1DD No date: Dilated cardiomyopathy     Comment:  a.) TTE 08/30/2012: EF 45%; b.) TTE 12/10/2016: EF 40%;               c.) MPI 08/22/2019: EF 57%; d.) TTE 10/05/2019: EF 50% No date: DOE (dyspnea on exertion) No date: Headache No date: History of bilateral cataract extraction No date: Hyperlipidemia No date: Hypertension 07/2005: Myocardial infarction 08/07/2005: S/P CABG x 4     Comment:  a.) LIMA-LAD, SVG-D1, SVG-OM1,  SVG-PDA No date: Valvular heart disease Past Surgical History: 10/23/2020: CARPOMETACARPAL ([place]) FUSION OF THUMB; Left     Comment:  Procedure: SUSPENSION ARTHROPLASTY OF LEFT THUMB [place]               JOINT;  Surgeon: Corky Mull, MD;  Location: [facility] ORS;               Service: Orthopedics;  Laterality: Left; No date: CATARACT EXTRACTION; Bilateral 08/07/2005: CORONARY ARTERY BYPASS GRAFT; N/A     Comment:  Procedure: CORONARY ARTERY BYPASS GRAFT; Location: [facility];              Surgeon: Marnee Guarneri, MD No date: ESOPHAGEAL DILATION     Comment:  x2 No date: KNEE ARTHROSCOPY No date: ROTATOR CUFF REPAIR; Right No date: TONSILLECTOMY BMI    Body Mass Index: 25.78 kg/m     Reproductive/Obstetrics negative OB ROS                             Anesthesia Physical Anesthesia Plan  ASA: 3  Anesthesia Plan: Spinal   Post-op  Pain Management:    Induction:   PONV Risk Score and Plan: 3  Airway Management Planned:   Additional Equipment:   Intra-op Plan:   Post-operative Plan:   Informed Consent: I have reviewed the patients History and Physical, chart, labs and discussed the procedure including the risks, benefits and alternatives for the proposed anesthesia with the patient or authorized representative who has indicated his/her understanding and acceptance.     Dental Advisory Given  Plan Discussed with: CRNA  Anesthesia Plan Comments:        Anesthesia Quick Evaluation

## 2022-12-15 NOTE — Anesthesia Procedure Notes (Signed)
Spinal  Patient location during procedure: OR Start time: 12/15/2022 11:03 AM Reason for block: surgical anesthesia Staffing Performed: resident/CRNA  Anesthesiologist: Molli Barrows, MD Resident/CRNA: Patience Musca., CRNA Performed by: Patience Musca., CRNA Authorized by: Martha Clan, MD   Preanesthetic Checklist Completed: patient identified, IV checked, site marked, risks and benefits discussed, surgical consent, monitors and equipment checked, pre-op evaluation and timeout performed Spinal Block Patient position: sitting Prep: Betadine Patient monitoring: heart rate, continuous pulse ox, blood pressure and cardiac monitor Approach: midline Location: L4-5 Injection technique: single-shot Needle Needle type: [place] and Introducer  Needle gauge: 24 G Needle length: 9 cm Assessment Events: CSF return Additional Notes Negative paresthesia. Negative blood return. Positive free-flowing CSF. Expiration date of kit checked and confirmed. Patient tolerated procedure well, without complications.

## 2022-12-15 NOTE — H&P (Signed)
History of Present Illness:  Gregory [name] is a 89 y.o. male who presents for a history and physical for an upcoming left partial versus total knee replacement to be done on December 15, 2022 by Dr. Roland Rack. The patient has been seen by Dr. Roland Rack for a follow-up of his left knee pain secondary to advanced degenerative joint disease. The patient was last seen for the symptoms nearly 5 months ago. At this visit, he had received a steroid injection into the left knee which he states provided little if any relief of his symptoms. He continues to note moderate to severe pain in his left knee which he rates as high as 6/10 on today's visit. His symptoms are worse with any prolonged standing or ambulation. He also notes occasional stabs of pain with certain twisting motions, or when he first gets up after sitting for any length of time. He has difficulty reciprocating stairs. He has not been taking any medications for discomfort, but has been applying some ice to the knee with limited benefit. He denies any reinjury to the knee since his last visit, and denies any numbness or paresthesias down his leg to his foot. The patient is not a diabetic.  Current Outpatient Medications: acetaminophen (TYLENOL) 500 MG tablet Take by mouth  aspirin 81 MG EC tablet Take 81 mg by mouth once daily.  fenofibric (TRILIPIX) 135 mg DR capsule TAKE 1 CAPSULE (135 MG TOTAL) BY MOUTH ONCE DAILY 90 capsule 3  lisinopriL (ZESTRIL) 20 MG tablet TAKE 1 TABLET BY MOUTH EVERY DAY 90 tablet 3  metoprolol succinate (TOPROL-XL) 25 MG XL tablet TAKE 1/2 TABLET BY MOUTH EVERY DAY 45 tablet 3  multivitamin tablet Take 1 tablet by mouth once daily.  pravastatin (PRAVACHOL) 80 MG tablet TAKE 1 TABLET BY MOUTH EVERYDAY AT BEDTIME 90 tablet 3   Allergies:  Codeine Nausea  Hydrocodone Nausea   Past Medical History:  Arthritis involving multiple sites 12/18/2014 (Right shoulder, knees, back)  Benign essential hypertension 01/17/2015  Bilateral  carotid artery stenosis 03/27/2020 (Less than 50% bilaterally 2021)  CAD (coronary artery disease) of artery bypass graft 01/02/2014  with LIMA to LAD, SVG to OM1, D1 and RCA, 11/06.  Complete tear of right rotator cuff 07/10/2014  Coronary artery disease  Coronary artery disease of autologous bypass graft with stable angina pectoris (CMS-HCC) 11/26/2016  DDD (degenerative disc disease), lumbar 06/12/2015  Frequent PVCs 09/02/2017  HTN (hypertension)  Hyperlipidemia  Impingement syndrome, shoulder 05/11/2014  Lumbar radiculitis 06/12/2015  Lumbar stenosis with neurogenic claudication 06/12/2015  Mixed hyperlipidemia  Myocardial infarction (CMS-HCC)  Primary osteoarthritis of first carpometacarpal joint of left hand 09/18/2020  Primary osteoarthritis of left knee 11/18/2016  Primary osteoarthritis of right knee 05/11/2014  S/P rotator cuff repair 09/10/2014  SOBOE (shortness of breath on exertion) 11/26/2016  Status post right unicompartmental knee replacement 05/13/2017  Valvular heart disease (Mild)   Past Surgical History:  Arthroscopic release of the long head of the biceps tendon followed by arthroscopic subacromial decompression and then incision rotator cuff repair. Right 08/08/2014  JOINT REPLACEMENT Right 05/10/2017 (Medial MAKO Dr. Jefm Bryant) KNEE ARTHROSCOPY Right 05/10/2017  Suspension arthroplasty left thumb CMC joint. Left 10/23/2020 (Dr.Jaydrian Corpening)  Bilateral cataracts removed  CARDIAC CATHETERIZATION  COLONOSCOPY 11/11/2001, 12/02/2006, 03/23/2012  CORONARY ARTERY BYPASS GRAFT  EGD 08/20/1994, 11/17/1995  Esophageal dilation x2  HERNIA REPAIR (Bilateral inguinal hernia repairs)  TONSILLECTOMY  T&A  wisdow teeth removal Bilateral   Family History  Problem Relation Age of Onset  Pancreatic cancer  Mother  Heart disease Father  Coronary Artery Disease (Blocked arteries around heart) Brother  Alcohol abuse Other   Social History:   Socioeconomic History:  Marital status: Single   Tobacco Use  Smoking status: Never  Smokeless tobacco: Never  Vaping Use  Vaping Use: Never used  Substance and Sexual Activity  Alcohol use: No  Alcohol/week: 0.0 standard drinks of alcohol  Drug use: No  Sexual activity: Defer   Review of Systems:  A comprehensive 14 point ROS was performed, reviewed, and the pertinent orthopaedic findings are documented in the HPI.  Physical Exam: Vitals:  12/04/22 1348  BP: (!) 140/76  Weight: 79.4 kg (175 lb)  Height: 170.2 cm (5\' 7" )  PainSc: 8  PainLoc: Knee   General/Constitutional: The patient appears to be well-nourished, well-developed, and in no acute distress. Neuro/Psych: Normal mood and affect, oriented to person, place and time. Eyes: Non-icteric. Pupils are equal, round, and reactive to light, and exhibit synchronous movement. ENT: Unremarkable. Lymphatic: No palpable adenopathy. Respiratory: Lungs clear to auscultation, Normal chest excursion, No wheezes, and Non-labored breathing Cardiovascular: Regular rate and rhythm. No murmurs. and No edema, swelling or tenderness, except as noted in detailed exam. Integumentary: No impressive skin lesions present, except as noted in detailed exam. Musculoskeletal: Unremarkable, except as noted in detailed exam.  Heart: Examination of the heart reveals regular, rate, and rhythm. There is no murmur noted on ascultation. There is a normal apical pulse.  Lungs: Lungs are clear to auscultation. There is no wheeze, rhonchi, or crackles. There is normal expansion of bilateral chest walls.   Left knee exam: GAIT: Essentially normal and uses no assistive devices. ALIGNMENT: Mild varus SKIN: Unremarkable SWELLING: Minimal EFFUSION: Trace WARMTH: None TENDERNESS: Mild tenderness along medial joint line, but no lateral joint line or peripatellar tenderness ROM: 0 to 130 degrees without pain McMURRAY'S: Negative PATELLOFEMORAL: Normal tracking with no peri-patellar tenderness and negative  apprehension sign CREPITUS: At most minimal patellofemoral crepitance LACHMAN'S: Negative PIVOT SHIFT: Negative ANTERIOR DRAWER: Negative POSTERIOR DRAWER: Negative VARUS/VALGUS: Mildly positive pseudolaxity to varus stressing  He remains neurovascularly intact to the left lower extremity and foot.  Assessment: Primary osteoarthritis of left knee.   Plan: The treatment options were discussed with the patient and his twin brother. In addition, patient educational materials were provided regarding the diagnosis and treatment options. The patient is quite frustrated by his symptoms and functional limitations, and is ready to consider more aggressive treatment options. Therefore, I have recommended a surgical procedure, specifically a left partial versus total knee arthroplasty. The patient has done very well from a prior right partial knee replacement by Dr. Jefm Bryant I would like to see if this is an option for him on his left knee. Therefore, I will order an MRI scan of his left knee to assess the lateral and patellofemoral compartments and see if he is a candidate for a partial knee replacement. The procedure was discussed with the patient, as were the potential risks (including bleeding, infection, nerve and/or blood vessel injury, persistent or recurrent pain, loosening and/or failure of the components, dislocation, need for further surgery, blood clots, strokes, heart attacks and/or arhythmias, pneumonia, etc.) and benefits. The patient states his/her understanding and wishes to proceed. All of the patient's questions and concerns were answered. He can call any time with further concerns. He will follow up post-surgery, routine.    H&P reviewed and patient re-examined. No changes.

## 2022-12-15 NOTE — Transfer of Care (Signed)
Immediate Anesthesia Transfer of Care Note  Patient: Gregory [name]  Procedure(s) Performed: TOTAL KNEE ARTHROPLASTY - RNFA (Left: Knee)  Patient Location: PACU  Anesthesia Type:General and Spinal  Level of Consciousness: drowsy  Airway & Oxygen Therapy: Patient Spontanous Breathing and Patient connected to nasal cannula oxygen  Post-op Assessment: Report given to RN and Post -op Vital signs reviewed and stable  Post vital signs: Reviewed and stable  Last Vitals:  Vitals Value Taken Time  BP 111/52 12/15/22 1330  Temp    Pulse 66 12/15/22 1333  Resp 16 12/15/22 1333  SpO2 98 % 12/15/22 1333  Vitals shown include unvalidated device data.  Last Pain:  Vitals:   12/15/22 0819  PainSc: 0-No pain         Complications: No notable events documented.

## 2022-12-15 NOTE — Evaluation (Signed)
Physical Therapy Evaluation Patient Details Name: Gregory [name] MRN: [id number] DOB: [birth year] Today's Date: 12/15/2022  History of Present Illness  Pt is an 89 yo M diagnosed with degenerative joint disease of the left knee and is s/p elective L TKA.  PMH includes HTN, CAD, MI, lumbar DDD, and R RTC repair.  Pt also reported a history of R partial knee replacement.   Clinical Impression  Pt was pleasant and motivated to participate during the session and put forth good effort throughout. Pt required extra cuing and time to process most commands during the session.  Pt required extra time and effort with bed mobility tasks but no physical assist.  Pt did require physical assistance to come to standing and for stability while in standing and struggled to sequence steps at the EOB needing cuing and min A to guide the RW and for stability.  Pt will benefit from continued PT services upon discharge to safely address deficits listed in patient problem list for decreased caregiver assistance and eventual return to PLOF.          Recommendations for follow up therapy are one component of a multi-disciplinary discharge planning process, led by the attending physician.  Recommendations may be updated based on patient status, additional functional criteria and insurance authorization.  Follow Up Recommendations Can patient physically be transported by private vehicle: No     Assistance Recommended at Discharge Frequent or constant Supervision/Assistance  Patient can return home with the following  A lot of help with walking and/or transfers;A little help with bathing/dressing/bathroom;Assistance with cooking/housework;Assist for transportation;Help with stairs or ramp for entrance    Equipment Recommendations Rolling walker (2 wheels);BSC/3in1  Recommendations for Other Services       Functional Status Assessment Patient has had a recent decline in their functional status and demonstrates the  ability to make significant improvements in function in a reasonable and predictable amount of time.     Precautions / Restrictions Precautions Precautions: Fall Restrictions Weight Bearing Restrictions: Yes LLE Weight Bearing: Weight bearing as tolerated      Mobility  Bed Mobility Overal bed mobility: Modified Independent             General bed mobility comments: Extra time, effort, and use of bed rail required    Transfers Overall transfer level: Needs assistance Equipment used: Rolling walker (2 wheels) Transfers: Sit to/from Stand Sit to Stand: Min assist, Mod assist           General transfer comment: Mod multi-modal cues for sequencing with min to mod A for stability upon initial stand    Ambulation/Gait Ambulation/Gait assistance: Min assist Gait Distance (Feet): 2 Feet Assistive device: Rolling walker (2 wheels) Gait Pattern/deviations: Step-to pattern, Decreased step length - right, Decreased stance time - left, Trunk flexed Gait velocity: decreased     General Gait Details: Mod multi-modal cues for sequencing with min A for stability and to guide the RW  Stairs            Wheelchair Mobility    Modified Rankin (Stroke Patients Only)       Balance Overall balance assessment: Needs assistance Sitting-balance support: Feet unsupported, Bilateral upper extremity supported Sitting balance-Leahy Scale: Fair     Standing balance support: Bilateral upper extremity supported, During functional activity, Reliant on assistive device for balance Standing balance-Leahy Scale: Poor  Pertinent Vitals/Pain Pain Assessment Pain Assessment: No/denies pain    Home Living Family/patient expects to be discharged to:: Private residence Living Arrangements: Other relatives Available Help at Discharge: Family;Available 24 hours/day Type of Home: House Home Access: Stairs to enter Entrance Stairs-Rails:  None Entrance Stairs-Number of Steps: 2   Home Layout: One level Home Equipment: None      Prior Function Prior Level of Function : Independent/Modified Independent;History of Falls (last six months)             Mobility Comments: Ind amb very limited community distances without an AD with PRN SPC use with cane borrowed from a friend, one fall in the last 6 months ADLs Comments: Ind with ADLs     Hand Dominance        Extremity/Trunk Assessment   Upper Extremity Assessment Upper Extremity Assessment: Generalized weakness    Lower Extremity Assessment Lower Extremity Assessment: Generalized weakness;LLE deficits/detail LLE Deficits / Details: BLE ankle strength, AROM, and sensation to light touch WFL; L knee ext and hip flex strength >/= 3/5 LLE Sensation: decreased light touch (light touch grossly intact to ankle and thigh but decreased around the knee)       Communication   Communication: No difficulties  Cognition Arousal/Alertness: Awake/alert Behavior During Therapy: WFL for tasks assessed/performed Overall Cognitive Status: Within Functional Limits for tasks assessed                                 General Comments: Min difficulty following commands at times, required increased multi-modal cuing and time to process        General Comments      Exercises Total Joint Exercises Ankle Circles/Pumps: AROM, Strengthening, Both, 10 reps Quad Sets: AROM, Strengthening, Left, 5 reps, 10 reps Hip ABduction/ADduction: Strengthening, Left, 5 reps, AROM Straight Leg Raises: AROM, Strengthening, Left, 5 reps Long Arc Quad: AROM, Strengthening, Left, 5 reps, 10 reps Knee Flexion: AROM, Strengthening, Left, 5 reps, 10 reps Marching in Standing: AROM, Strengthening, Both, 5 reps, Standing Other Exercises Other Exercises: HEP education for LLE QS and seated knee flexion Other Exercises: LLE positioning education to promote L knee ext PROM and prevent heel  pressure   Assessment/Plan    PT Assessment Patient needs continued PT services  PT Problem List Decreased strength;Decreased range of motion;Decreased activity tolerance;Decreased balance;Decreased mobility;Decreased knowledge of use of DME       PT Treatment Interventions DME instruction;Gait training;Stair training;Functional mobility training;Therapeutic activities;Therapeutic exercise;Balance training;Patient/family education    PT Goals (Current goals can be found in the Care Plan section)  Acute Rehab PT Goals Patient Stated Goal: To be able to walk and work in the yard PT Goal Formulation: With patient Time For Goal Achievement: 12/28/22 Potential to Achieve Goals: Good    Frequency BID     Co-evaluation               AM-PAC PT "6 Clicks" Mobility  Outcome Measure Help needed turning from your back to your side while in a flat bed without using bedrails?: A Little Help needed moving from lying on your back to sitting on the side of a flat bed without using bedrails?: A Little Help needed moving to and from a bed to a chair (including a wheelchair)?: A Little Help needed standing up from a chair using your arms (e.g., wheelchair or bedside chair)?: A Little Help needed to walk in hospital room?: A Lot  Help needed climbing 3-5 steps with a railing? : Total 6 Click Score: 15    End of Session Equipment Utilized During Treatment: Gait belt Activity Tolerance: Patient tolerated treatment well Patient left: in bed;with call bell/phone within reach;with bed alarm set;Other (comment) (Polar care donned to L knee) Nurse Communication: Mobility status;Weight bearing status;Other (comment) (Pt needs SCD on) PT Visit Diagnosis: Unsteadiness on feet (R26.81);History of falling (Z91.81);Other abnormalities of gait and mobility (R26.89);Muscle weakness (generalized) (M62.81)    Time: 1526-1600 PT Time Calculation (min) (ACUTE ONLY): 34 min   Charges:   PT Evaluation $PT  Eval Moderate Complexity: 1 Mod PT Treatments $Therapeutic Exercise: 8-22 mins       D. Royetta Asal PT, DPT 12/15/22, 4:28 PM

## 2022-12-15 NOTE — Op Note (Signed)
12/15/2022  1:40 PM  Patient:   Gregory [name]  Pre-Op Diagnosis:   Degenerative joint disease, left knee.  Post-Op Diagnosis:   Same  Procedure:   Left TKA using all-cemented [employer] Vanguard system with a 70 mm PCR femur, a 75 mm tibial tray with a 10 mm anterior stabilized E-poly insert, and a 37 x 8.6 mm all-poly 3-pegged domed patella.  Surgeon:   Pascal Lux, MD  Assistant:   Cameron Proud, PA-C   Anesthesia:   Spinal  Findings:   As above  Complications:   None  EBL:   15 cc  Fluids:   600 cc crystalloid  UOP:   None  TT:   105 minutes at 300 mmHg  Drains:   None  Closure:   Staples  Implants:   As above  Brief Clinical Note:   The patient is an 89 year old male with a long history of progressively worsening left knee pain. The patient's symptoms have progressed despite medications, activity modification, injections, etc. The patient's history and examination were consistent with advanced degenerative joint disease of the left knee confirmed by plain radiographs. The patient presents at this time for a left total knee arthroplasty.  Procedure:   The patient was brought into the operating room. After adequate spinal anesthesia was obtained, the patient was lain in the supine position before the left lower extremity was prepped with ChloraPrep solution and draped sterilely. Preoperative antibiotics were administered. A timeout was performed to verify the appropriate surgical site before the limb was exsanguinated with an Esmarch and the tourniquet inflated to 300 mmHg.   A standard anterior approach to the knee was made through an approximately 7 inch incision. The incision was carried down through the subcutaneous tissues to expose superficial retinaculum. This was split the length of the incision and the medial flap elevated sufficiently to expose the medial retinaculum. The medial retinaculum was incised, leaving a 3-4 mm cuff of tissue on the patella. This was  extended distally along the medial border of the patellar tendon and proximally through the medial third of the quadriceps tendon. A subtotal fat pad excision was performed before the soft tissues were elevated off the anteromedial and anterolateral aspects of the proximal tibia to the level of the collateral ligaments. The anterior portions of the medial and lateral menisci were removed, as was the anterior cruciate ligament. With the knee flexed to 90, the external tibial guide was positioned and the appropriate proximal tibial cut made. This piece was taken to the back table where it was measured and found to be optimally replicated by a 75 mm component.  Attention was directed to the distal femur. The intramedullary canal was accessed through a 3/8" drill hole. The intramedullary guide was inserted and positioned in order to obtain a neutral flexion gap. The intercondylar block was positioned with care taken to avoid notching the anterior cortex of the femur. The appropriate cut was made. Next, the distal cutting block was placed at 5 of valgus alignment. Using the 9 mm slot, the distal cut was made. The distal femur was measured and found to be optimally replicated by the 70 mm component. The 70 mm 4-in-1 cutting block was positioned and first the posterior, then the posterior chamfer, the anterior chamfer, and finally the anterior cuts were made. At this point, the posterior portions medial and lateral menisci were removed. A trial reduction was performed using the appropriate femoral and tibial components with the 10 mm insert. This  demonstrated excellent stability to varus and valgus stressing both in flexion and extension while permitting full extension. Patella tracking was assessed and found to be excellent. Therefore, the tibial guide position was marked on the proximal tibia. The patella thickness was measured and found to be 23 mm. Therefore, the appropriate cut was made. The patellar surface was  measured and found to be optimally replicated by the 37 mm component. The three peg holes were drilled in place before the trial button was inserted. Patella tracking was assessed and found to be excellent, passing the "no thumb test". The lug holes were drilled into the distal femur before the trial component was removed, leaving only the tibial tray. The keel was then created using the appropriate tower, reamer, and punch.  The bony surfaces were prepared for cementing by irrigating them thoroughly with sterile saline solution via the jet lavage system. A bone plug was fashioned from some of the bone that had been removed previously and used to plug the distal femoral canal. In addition, a "cocktail" of 20 cc of Exparel, 30 cc of 0.5% Sensorcaine, 2 cc of Kenalog 40 (80 mg), and 30 mg of Toradol diluted out to 90 cc with normal saline was injected into the postero-medial and postero-lateral aspects of the knee, the medial and lateral gutter regions, and the peri-incisional tissues to help with postoperative analgesia. Meanwhile, the cement was being mixed on the back table. When it was ready, the tibial tray was cemented in first. The excess cement was removed using [employer] fast streamer. Next, the femoral component was impacted into place. Again, the excess cement was removed using [employer] fast streamer. The 10 mm trial insert was positioned and the knee brought into extension while the cement hardened. Finally, the patella was cemented into place and secured using the patellar clamp. Again, the excess cement was removed using [employer] fast streamer. Once the cement had hardened, the knee was placed through a range of motion with the findings as described above. Therefore, the trial insert was removed and, after verifying that no cement had been retained posteriorly, the permanent 10 mm anterior stabilized E-polyethylene insert was positioned and secured using the appropriate key locking mechanism. Again the knee was placed  through a range of motion with the findings as described above.  The wound was copiously irrigated with sterile saline solution using the jet lavage system before the quadriceps tendon and retinacular layer were reapproximated using #0 Vicryl interrupted sutures. The superficial retinacular layer also was closed using a running #0 Vicryl suture. A total of 10 cc of transexemic acid (TXA) was injected intra-articularly before the subcutaneous tissues were closed in several layers using 2-0 Vicryl interrupted sutures. The skin was closed using staples. A sterile honeycomb dressing was applied to the skin before the leg was wrapped with an Ace wrap to accommodate the Polar Care device. The patient was then awakened and returned to the recovery room in satisfactory condition after tolerating the procedure well.

## 2022-12-16 ENCOUNTER — Encounter: Payer: Self-pay | Admitting: Surgery

## 2022-12-16 LAB — CBC
HCT: 36.1 % — ABNORMAL LOW (ref 39.0–52.0)
Hemoglobin: 12.3 g/dL — ABNORMAL LOW (ref 13.0–17.0)
MCH: 30.1 pg (ref 26.0–34.0)
MCHC: 34.1 g/dL (ref 30.0–36.0)
MCV: 88.3 fL (ref 80.0–100.0)
Platelets: 181 10*3/uL (ref 150–400)
RBC: 4.09 MIL/uL — ABNORMAL LOW (ref 4.22–5.81)
RDW: 13.7 % (ref 11.5–15.5)
WBC: 12.1 10*3/uL — ABNORMAL HIGH (ref 4.0–10.5)
nRBC: 0 % (ref 0.0–0.2)

## 2022-12-16 LAB — BASIC METABOLIC PANEL
Anion gap: 7 (ref 5–15)
BUN: 22 mg/dL (ref 8–23)
CO2: 22 mmol/L (ref 22–32)
Calcium: 8.7 mg/dL — ABNORMAL LOW (ref 8.9–10.3)
Chloride: 108 mmol/L (ref 98–111)
Creatinine, Ser: 0.82 mg/dL (ref 0.61–1.24)
GFR, Estimated: >60 mL/min (ref >60)
Glucose, Bld: 169 mg/dL — ABNORMAL HIGH (ref 70–99)
Potassium: 3.2 mmol/L — ABNORMAL LOW (ref 3.5–5.1)
Sodium: 137 mmol/L (ref 135–145)

## 2022-12-16 MED ORDER — APIXABAN 2.5 MG PO TABS: 2.5000 mg | ORAL_TABLET | Freq: Two times a day (BID) | ORAL | 0 refills | Status: AC

## 2022-12-16 MED ORDER — TRAMADOL HCL 50 MG PO TABS: 50.0000 mg | ORAL_TABLET | Freq: Four times a day (QID) | ORAL | 0 refills | Status: AC | PRN

## 2022-12-16 MED ORDER — ACETAMINOPHEN 500 MG PO TABS
ORAL_TABLET | ORAL | Status: AC
  Administered 2022-12-16: 1000 mg via ORAL
  Filled 2022-12-16: qty 2

## 2022-12-16 MED ORDER — DOCUSATE SODIUM 100 MG PO CAPS
ORAL_CAPSULE | ORAL | Status: AC
  Filled 2022-12-16: qty 1

## 2022-12-16 MED ORDER — OXYCODONE HCL 5 MG PO TABS: 2.5000 mg | ORAL_TABLET | ORAL | 0 refills | Status: AC | PRN

## 2022-12-16 MED ORDER — APIXABAN 2.5 MG PO TABS
ORAL_TABLET | ORAL | Status: AC
  Filled 2022-12-16: qty 1

## 2022-12-16 MED ORDER — ADULT MULTIVITAMIN W/MINERALS CH
ORAL_TABLET | ORAL | Status: AC
  Filled 2022-12-16: qty 1

## 2022-12-16 MED ORDER — CEFAZOLIN SODIUM-DEXTROSE 2-4 GM/100ML-% IV SOLN
INTRAVENOUS | Status: AC
  Filled 2022-12-16: qty 100

## 2022-12-16 MED ORDER — APIXABAN 2.5 MG PO TABS: 2.5000 mg | ORAL_TABLET | Freq: Two times a day (BID) | ORAL | 0 refills | Status: DC

## 2022-12-16 MED ORDER — LISINOPRIL 20 MG PO TABS
ORAL_TABLET | ORAL | Status: AC
  Administered 2022-12-16: 20 mg via ORAL
  Filled 2022-12-16: qty 1

## 2022-12-16 MED ORDER — HALOPERIDOL 2 MG PO TABS
2.0000 mg | ORAL_TABLET | Freq: Four times a day (QID) | ORAL | Status: DC | PRN
  Filled 2022-12-16: qty 1

## 2022-12-16 MED ORDER — HALOPERIDOL LACTATE 5 MG/ML IJ SOLN
2.0000 mg | Freq: Four times a day (QID) | INTRAMUSCULAR | Status: DC | PRN
  Administered 2022-12-16 – 2022-12-17 (×2): 2 mg via INTRAMUSCULAR
  Filled 2022-12-16 (×4): qty 1

## 2022-12-16 NOTE — Progress Notes (Signed)
Received from PACU. He is oriented x3; disoriented to situation. Denies pain; incontinent x2.  Pt do not have PIV; per report MD is aware.    12/16/22 1806  Vitals  Temp 98.2 F (36.8 C)  Temp Source Oral  BP (!) 151/115  MAP (mmHg) 125  BP Location Left Arm  BP Method Automatic  Patient Position (if appropriate) Lying  Pulse Rate 87  Pulse Rate Source Monitor  Resp 20  Level of Consciousness  Level of Consciousness Alert  MEWS COLOR  MEWS Score Color Green  Oxygen Therapy  SpO2 97 %  O2 Device Room Air  Pain Assessment  Pain Scale 0-10  Pain Score 0

## 2022-12-16 NOTE — TOC Initial Note (Signed)
Transition of Care [facility]) - Initial/Assessment Note    Patient Details  Name: Gregory [name] MRN: [id number] Date of Birth: 1932/03/03  Transition of Care [facility]) CM/SW Contact:    Beverly Sessions, RN Phone Number: 12/16/2022, 9:46 AM  Clinical Narrative:                  Patient to discharge today Therapy and MD in agreement for home with home health if brother is able to provide 24/7 supervision Brother to provide transport at discharge  Patient was set up wit [facility] prior to admission.  [place] with [facility] notified  Referral for [facility] and RW made to [facility] with [employer]         Patient Goals and CMS Choice            Expected Discharge Plan and Services         Expected Discharge Date: 12/16/22                                    Prior Living Arrangements/Services                       Activities of Daily Living Home Assistive Devices/Equipment: Bedside commode/3-in-1, Cane (specify quad or straight), Walker (specify type) ADL Screening (condition at time of admission) Patient's cognitive ability adequate to safely complete daily activities?: Yes Is the patient deaf or have difficulty hearing?: Yes Does the patient have difficulty seeing, even when wearing glasses/contacts?: No Does the patient have difficulty concentrating, remembering, or making decisions?: No Patient able to express need for assistance with ADLs?: Yes Does the patient have difficulty dressing or bathing?: No Independently performs ADLs?: Yes (appropriate for developmental age) Does the patient have difficulty walking or climbing stairs?: Yes Weakness of Legs: Left Weakness of Arms/Hands: None  Permission Sought/Granted                  Emotional Assessment              Admission diagnosis:  Status post total knee replacement using cement, left [Z96.652] Patient Active Problem List   Diagnosis Date Noted   Status post total knee replacement  using cement, 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   PCP:  Sofie Hartigan, MD Pharmacy:   [id number] - [place], [place] - [address] [place] [address] Phone: [phone] Fax: [phone]     Social Determinants of Health (SDOH) Social History: SDOH Screenings   Food Insecurity: No Food Insecurity (12/15/2022)  Housing: Low Risk  (12/15/2022)  Transportation Needs: No Transportation Needs (12/15/2022)  Utilities: Not At Risk (12/15/2022)  Tobacco Use: Low Risk  (12/16/2022)   SDOH Interventions:     Readmission Risk Interventions     No data to display

## 2022-12-16 NOTE — Discharge Instructions (Addendum)
TOTAL KNEE REPLACEMENT POSTOPERATIVE DIRECTIONS  Knee Rehabilitation, Guidelines Following Surgery  Results after knee surgery are often greatly improved when you follow the exercise, range of motion and muscle strengthening exercises prescribed by your doctor. Safety measures are also important to protect the knee from further injury. Any time any of these exercises cause you to have increased pain or swelling in your knee joint, decrease the amount until you are comfortable again and slowly increase them. If you have problems or questions, call your caregiver or physical therapist for advice.   HOME CARE INSTRUCTIONS  Remove items at home which could result in a fall. This includes throw rugs or furniture in walking pathways.  ICE using the Polar Care unit to the affected knee every three hours for 30 minutes at a time and then as needed for pain and swelling.  Place a dry towel or pillow case over the knee before applying the Polar Care Unit.  Continue to use ice on the knee for pain and swelling from surgery. You may notice swelling that will progress down to the foot and ankle.  This is normal after surgery.  Elevate the leg when you are not up walking on it.   Continue to use the breathing machine which will help keep your temperature down.  It is common for your temperature to cycle up and down following surgery, especially at night when you are not up moving around and exerting yourself.  The breathing machine keeps your lungs expanded and your temperature down. Do not place pillow under knee, focus on keeping the knee straight while resting  DIET You may resume your previous home diet once your are discharged from the hospital.  DRESSING / WOUND CARE / SHOWERING You may change your dressing 3-5 days after surgery.  Then change the dressing every day with sterile gauze.  Please use good hand washing techniques before changing the dressing.  Do not use any lotions or creams on the incision  until instructed by your surgeon.  The patient had his dressing changed by physical therapy. You need to keep your wound dry after being discharged home.  Just keep the incision dry and apply a dry gauze dressing on daily. Change the surgical dressing only if needed and reapply a dry dressing each time.  ACTIVITY Walk with your walker as instructed. Use walker as long as suggested by your caregivers. Avoid periods of inactivity such as sitting longer than an hour when not asleep. This helps prevent blood clots.  You may resume a sexual relationship in one month or when given the OK by your doctor.  You may return to work once you are cleared by your doctor.  Do not drive a car for 6 weeks or until released by you surgeon.  Do not drive while taking narcotics.  WEIGHT BEARING Weight bearing as tolerated with assist device (walker, cane, etc) as directed, use it as long as suggested by your surgeon or therapist, typically at least 4-6 weeks.  POSTOPERATIVE CONSTIPATION PROTOCOL Constipation - defined medically as fewer than three stools per week and severe constipation as less than one stool per week.  One of the most common issues patients have following surgery is constipation.  Even if you have a regular bowel pattern at home, your normal regimen is likely to be disrupted due to multiple reasons following surgery.  Combination of anesthesia, postoperative narcotics, change in appetite and fluid intake all can affect your bowels.  In order to avoid complications  following surgery, here are some recommendations in order to help you during your recovery period.  Colace (docusate) - Pick up an over-the-counter form of Colace or another stool softener and take twice a day as long as you are requiring postoperative pain medications.  Take with a full glass of water daily.  If you experience loose stools or diarrhea, hold the colace until you stool forms back up.  If your symptoms do not get better  within 1 week or if they get worse, check with your doctor.  Dulcolax (bisacodyl) - Pick up over-the-counter and take as directed by the product packaging as needed to assist with the movement of your bowels.  Take with a full glass of water.  Use this product as needed if not relieved by Colace only.   MiraLax (polyethylene glycol) - Pick up over-the-counter to have on hand.  MiraLax is a solution that will increase the amount of water in your bowels to assist with bowel movements.  Take as directed and can mix with a glass of water, juice, soda, coffee, or tea.  Take if you go more than two days without a movement. Do not use MiraLax more than once per day. Call your doctor if you are still constipated or irregular after using this medication for 7 days in a row.  If you continue to have problems with postoperative constipation, please contact the office for further assistance and recommendations.  If you experience "the worst abdominal pain ever" or develop nausea or vomiting, please contact the office immediatly for further recommendations for treatment.  ITCHING  If you experience itching with your medications, try taking only a single pain pill, or even half a pain pill at a time.  You can also use Benadryl over the counter for itching or also to help with sleep.   TED HOSE STOCKINGS Wear the elastic stockings on both legs for six weeks following surgery during the day but you may remove then at night for sleeping.  MEDICATIONS See your medication summary on the "After Visit Summary" that the nursing staff will review with you prior to discharge.  You may have some home medications which will be placed on hold until you complete the course of blood thinner medication.  It is important for you to complete the blood thinner medication as prescribed by your surgeon.  When the Eliquis is completed, begin taking aspirin 325 mg once a day for the next 4 weeks.  Continue your approved medications as  instructed at time of discharge.  PRECAUTIONS If you experience chest pain or shortness of breath - call 911 immediately for transfer to the hospital emergency department.  If you develop a fever greater that 101 F, purulent drainage from wound, increased redness or drainage from wound, foul odor from the wound/dressing, or calf pain - CONTACT YOUR SURGEON.                                                   FOLLOW-UP APPOINTMENTS Make sure you keep all of your appointments after your operation with your surgeon and caregivers. You should call the office at the above phone number and make an appointment for approximately two weeks after the date of your surgery or on the date instructed by your surgeon outlined in the "After Visit Summary".   RANGE OF MOTION AND  STRENGTHENING EXERCISES  Rehabilitation of the knee is important following a knee injury or an operation. After just a few days of immobilization, the muscles of the thigh which control the knee become weakened and shrink (atrophy). Knee exercises are designed to build up the tone and strength of the thigh muscles and to improve knee motion. Often times heat used for twenty to thirty minutes before working out will loosen up your tissues and help with improving the range of motion but do not use heat for the first two weeks following surgery. These exercises can be done on a training (exercise) mat, on the floor, on a table or on a bed. Use what ever works the best and is most comfortable for you Knee exercises include:  Leg Lifts - While your knee is still immobilized in a splint or cast, you can do straight leg raises. Lift the leg to 60 degrees, hold for 3 sec, and slowly lower the leg. Repeat 10-20 times 2-3 times daily. Perform this exercise against resistance later as your knee gets better.  Quad and Hamstring Sets - Tighten up the muscle on the front of the thigh (Quad) and hold for 5-10 sec. Repeat this 10-20 times hourly. Hamstring sets  are done by pushing the foot backward against an object and holding for 5-10 sec. Repeat as with quad sets.  Leg Slides: Lying on your back, slowly slide your foot toward your buttocks, bending your knee up off the floor (only go as far as is comfortable). Then slowly slide your foot back down until your leg is flat on the floor again. Angel Wings: Lying on your back spread your legs to the side as far apart as you can without causing discomfort.  A rehabilitation program following serious knee injuries can speed recovery and prevent re-injury in the future due to weakened muscles. Contact your doctor or a physical therapist for more information on knee rehabilitation.   IF YOU ARE TRANSFERRED TO A SKILLED REHAB FACILITY If the patient is transferred to a skilled rehab facility following release from the hospital, a list of the current medications will be sent to the facility for the patient to continue.  When discharged from the skilled rehab facility, please have the facility set up the patient's [place] prior to being released. Also, the skilled facility will be responsible for providing the patient with their medications at time of release from the facility to include their pain medication, the muscle relaxants, and their blood thinner medication. If the patient is still at the rehab facility at time of the two week follow up appointment, the skilled rehab facility will also need to assist the patient in arranging follow up appointment in our office and any transportation needs.  MAKE SURE YOU:  Understand these instructions.  Get help right away if you are not doing well or get worse.    Pick up stool softner and laxative for home use following surgery while on pain medications. Do not submerge incision under water. Please use good hand washing techniques while changing dressing each day. May shower starting three days after surgery. Please use a clean towel to pat the  incision dry following showers. Continue to use ice for pain and swelling after surgery. Do not use any lotions or creams on the incision until instructed by your surgeon.

## 2022-12-16 NOTE — Discharge Summary (Cosign Needed Addendum)
Physician Discharge Summary  Subjective: 8 Days Post-Op Procedure(s) (LRB): TOTAL KNEE ARTHROPLASTY - RNFA (Left) Patient reports pain as mild.   Patient seen in rounds with Dr. Joice LoftsPoggi. Patient is well, and has had no acute complaints or problems Patient is ready to go home with home health physical therapy.  Physician Discharge Summary  Patient ID: Gregory [name] MRN: [id number] DOB/AGE: 10/12/1931 89 y.o.  Admit date: 12/15/2022 Discharge date: 12/23/2022  Admission Diagnoses:  Discharge Diagnoses:  Principal Problem:   Status post total knee replacement using cement, left Active Problems:   Status post total knee replacement   Discharged Condition: stable   Hospital Course: The patient is postop day 3   from a left total knee arthroplasty.  The patient has had significant confusion and agitation during the night in the hospital on postop day 1 and postop day 2.  He has had to have security assist with controlling him.  He has received Haldol intramuscular for agitation.  The patient is out of his current environment.  The patient is doing much better now on postop day 3.  He seems to be more clearheaded and can have a conversation.  He has not been agitated during the evening.  The patient has good pain management.  His vitals have remained stable.  The patient has been confused since surgery.  Physical therapy was able to ambulate with him about 250 feet the day after surgery.  The patient will complete physical therapy goals before being discharged to home with HHPT  Treatments: surgery:   Left TKA using all-cemented [employer] Vanguard system with a 70 mm PCR femur, a 75 mm tibial tray with a 10 mm anterior stabilized E-poly insert, and a 37 x 8.6 mm all-poly 3-pegged domed patella.   Surgeon:   Maryagnes AmosJ. Jeffrey Poggi, MD   Assistant:   RNFA    Anesthesia:   Spinal   Findings:   As above   Complications:   None   EBL:   15 cc   Fluids:   600 cc crystalloid   UOP:   None   TT:    105 minutes at 300 mmHg   Drains:   None   Closure:   Staples   Implants:   As above  Discharge Exam: Blood pressure (!) 157/97, pulse 96, temperature 98 F (36.7 C), resp. rate 18, height 5\' 9"  (1.753 m), weight 79.2 kg, SpO2 97 %.   Disposition: Discharge disposition: 06-Home-Health Care Svc         Allergies as of 12/23/2022       Reactions   Hydrocodone Nausea Only        Medication List     STOP taking these medications    aspirin EC 325 MG tablet   aspirin EC 81 MG tablet       TAKE these medications    acetaminophen 500 MG tablet Commonly known as: TYLENOL Take 500 mg by mouth every 6 (six) hours as needed for headache.   apixaban 2.5 MG Tabs tablet Commonly known as: ELIQUIS Take 1 tablet (2.5 mg total) by mouth 2 (two) times daily for 14 days.   Fenofibric Acid 135 MG Cpdr Take 135 mg by mouth every morning.   lisinopril 20 MG tablet Commonly known as: ZESTRIL Take 20 mg by mouth every morning.   metoprolol succinate 25 MG 24 hr tablet Commonly known as: TOPROL-XL Take 12.5 mg by mouth every morning.   multivitamin with minerals Tabs tablet Take 1  tablet by mouth daily.   oxyCODONE 5 MG immediate release tablet Commonly known as: Oxy IR/ROXICODONE Take 0.5-1 tablets (2.5-5 mg total) by mouth every 4 (four) hours as needed for moderate pain (pain score 4-6).   pravastatin 40 MG tablet Commonly known as: PRAVACHOL Take 80 mg by mouth every evening.   psyllium 58.6 % powder Commonly known as: METAMUCIL Take 1 packet by mouth daily.   traMADol 50 MG tablet Commonly known as: ULTRAM Take 1 tablet (50 mg total) by mouth every 6 (six) hours as needed for moderate pain or severe pain (Or for breakthrough pain).               Durable Medical Equipment  (From admission, onward)           Start     Ordered   12/15/22 1322  DME Bedside commode  Once       Question:  Patient needs a bedside commode to treat with the  following condition  Answer:  Status post total knee replacement using cement, left   12/15/22 1321   12/15/22 1322  DME 3 n 1  Once        12/15/22 1321   12/15/22 1322  DME Walker rolling  Once       Question Answer Comment  Walker: With 5 Inch Wheels   Patient needs a walker to treat with the following condition Status post total knee replacement using cement, left      12/15/22 1321            Follow-up Information     Poggi, Excell Seltzer, MD. Go on 12/30/2022.   Specialty: Orthopedic Surgery Why: Staple removal;  Appt w/ Dedra Skeens, PA-C @ 9:45 am Contact information: [address] [facility] [place] [address] [phone]                 Signed: Amador Cunas [facility] 12/23/2022, 8:28 AM   Objective: Vital signs in last 24 hours: Temp:  [98 F (36.7 C)] 98 F (36.7 C) (04/10 0044) Pulse Rate:  [86-96] 96 (04/10 0044) Resp:  [18] 18 (04/10 0044) BP: (131-157)/(68-97) 157/97 (04/10 0044) SpO2:  [97 %] 97 % (04/10 0044)  Intake/Output from previous day:  Intake/Output Summary (Last 24 hours) at 12/23/2022 0828 Last data filed at 12/23/2022 0044 Gross per 24 hour  Intake 240 ml  Output 100 ml  Net 140 ml     Intake/Output this shift: No intake/output data recorded.  Labs: No results for input(s): "HGB" in the last 72 hours.  No results for input(s): "WBC", "RBC", "HCT", "PLT" in the last 72 hours.  No results for input(s): "NA", "K", "CL", "CO2", "BUN", "CREATININE", "GLUCOSE", "CALCIUM" in the last 72 hours.  No results for input(s): "LABPT", "INR" in the last 72 hours.  EXAM: General - Patient is alert and mildly confused. Extremity - Neurovascular intact Sensation intact distally Dorsiflexion/Plantar flexion intact Compartment soft Incision - clean, dry, no drainage Motor Function -patient dorsiflexion intact.  Able to straight leg raise independently.  Assessment/Plan: 8 Days Post-Op Procedure(s) (LRB): TOTAL  KNEE ARTHROPLASTY - RNFA (Left) Procedure(s) (LRB): TOTAL KNEE ARTHROPLASTY - RNFA (Left) Past Medical History:  Diagnosis Date   Arthritis    CAD (coronary artery disease) 08/05/2005   a.) LHC 08/05/2005: 20% pLM, 60% pLAD, 80% mLAD, 100% OM1, 40% pRCA, 100% mRCA -- CVTS consult; b.) 4v CABG 08/07/2005   Carotid stenosis    DDD (degenerative disc disease), lumbar  Diastolic dysfunction    a.) TTE 08/30/2012: EF 45%, lat HK, triv MR, G1DD; b.) TTE 12/10/2016: EF 40%, mild LAE, mild panvalvular regurg; c.) TTE 10/05/2019: EF 50%, mild BAE, mild RVE, triv AR/PR, mild MR/TR, G1DD   Dilated cardiomyopathy    a.) TTE 08/30/2012: EF 45%; b.) TTE 12/10/2016: EF 40%; c.) MPI 08/22/2019: EF 57%; d.) TTE 10/05/2019: EF 50%   DOE (dyspnea on exertion)    Headache    History of bilateral cataract extraction    Hyperlipidemia    Hypertension    Myocardial infarction 07/2005   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   Weight as of this encounter: 79.2 kg. Discharge to rehab for physical therapy. Diet - Regular diet Follow up - in 2 weeks Activity - WBAT Disposition -to rehab Condition Upon Discharge - Stable DVT Prophylaxis - TED hose and Eliquis  Dedra Skeensodd Mundy, PA-C Orthopaedic Surgery 12/23/2022, 8:28 AM

## 2022-12-16 NOTE — Progress Notes (Signed)
Pt alert to self with confusion to time place and situation. Thinks he is at home. Redirected that he is in the hospital but he disagrees. Not following directions or weight bearing restrictions. Keeps getting out of bed or chair without assistance saying he is at home. Wants to go to various rooms in the house. Pt refused all HS meds despite several attempts. Dr Roland Rack paged and made aware. Order received for Haldol for agitation and to continue to try po meds. Pt continues to verbally threaten staff. Security called. Pt given IM Haldol, waiting for effect.

## 2022-12-16 NOTE — Progress Notes (Signed)
Subjective: 1 Day Post-Op Procedure(s) (LRB): TOTAL KNEE ARTHROPLASTY - RNFA (Left) Patient reports pain as mild.  Patient is very confused this morning. Patient is well, and has had no acute complaints or problems Plan is to go Home versus rehab after hospital stay.  Patient has good family support.  The patient was extremely confused this morning. Negative for chest pain and shortness of breath Fever: no Gastrointestinal: Negative for nausea and vomiting  Objective: Vital signs in last 24 hours: Temp:  [97.2 F (36.2 C)-98.6 F (37 C)] 98.6 F (37 C) (04/03 0140) Pulse Rate:  [61-86] 70 (04/03 0140) Resp:  [12-20] 16 (04/03 0140) BP: (111-153)/(50-80) 139/65 (04/03 0140) SpO2:  [94 %-99 %] 98 % (04/03 0140) Weight:  [79.2 kg] 79.2 kg (04/02 1411)  Intake/Output from previous day:  Intake/Output Summary (Last 24 hours) at 12/16/2022 0714 Last data filed at 12/16/2022 0610 Gross per 24 hour  Intake 2032.38 ml  Output 400 ml  Net 1632.38 ml    Intake/Output this shift: No intake/output data recorded.  Labs: Recent Labs    12/16/22 0636  HGB 12.3*   Recent Labs    12/16/22 0636  WBC 12.1*  RBC 4.09*  HCT 36.1*  PLT 181   Recent Labs    12/16/22 0636  NA 137  K 3.2*  CL 108  CO2 22  BUN 22  CREATININE 0.82  GLUCOSE 169*  CALCIUM 8.7*   No results for input(s): "LABPT", "INR" in the last 72 hours.   EXAM General - Patient is Alert and Confused Extremity - Neurovascular intact Sensation intact distally Dorsiflexion/Plantar flexion intact Compartment soft Full extension to 120 degrees of flexion. Dressing/Incision - clean, dry, no drainage Motor Function - intact, moving foot and toes well on exam.  Ambulated 2 feet with physical therapy.  Able to straight leg raise independently.  Past Medical History:  Diagnosis Date   Arthritis    CAD (coronary artery disease) 08/05/2005   a.) LHC 08/05/2005: 20% pLM, 60% pLAD, 80% mLAD, 100% OM1, 40% pRCA, 100%  mRCA -- CVTS consult; b.) 4v CABG 08/07/2005   Carotid stenosis    DDD (degenerative disc disease), lumbar    Diastolic dysfunction    a.) TTE 08/30/2012: EF 45%, lat HK, triv MR, G1DD; b.) TTE 12/10/2016: EF 40%, mild LAE, mild panvalvular regurg; c.) TTE 10/05/2019: EF 50%, mild BAE, mild RVE, triv AR/PR, mild MR/TR, G1DD   Dilated cardiomyopathy    a.) TTE 08/30/2012: EF 45%; b.) TTE 12/10/2016: EF 40%; c.) MPI 08/22/2019: EF 57%; d.) TTE 10/05/2019: EF 50%   DOE (dyspnea on exertion)    Headache    History of bilateral cataract extraction    Hyperlipidemia    Hypertension    Myocardial infarction 07/2005   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   Weight as of this encounter: 79.2 kg. Advance diet Up with therapy D/C IV fluids Discharge home with home health versus possible skilled nursing, depending on mental status.  DVT Prophylaxis - Foot Pumps, TED hose, and Eliquis Weight-Bearing as tolerated to left leg  Reche Dixon, PA-C Orthopaedic Surgery 12/16/2022, 7:14 AM

## 2022-12-16 NOTE — Progress Notes (Signed)
Pts discharge cancelled for today due to confusion

## 2022-12-16 NOTE — Progress Notes (Signed)
Report called to [place], RN,  pt going to room 143, pts brother Lynnae Sandhoff notified.

## 2022-12-16 NOTE — Anesthesia Postprocedure Evaluation (Signed)
Anesthesia Post Note  Patient: Gregory [name]  Procedure(s) Performed: TOTAL KNEE ARTHROPLASTY - RNFA (Left: Knee)  Patient location during evaluation: Short Stay Anesthesia Type: Spinal Level of consciousness: awake and alert and confused Pain management: pain level controlled Vital Signs Assessment: post-procedure vital signs reviewed and stable Respiratory status: spontaneous breathing and respiratory function stable Cardiovascular status: blood pressure returned to baseline and stable Postop Assessment: no headache, no backache, no apparent nausea or vomiting and patient able to bend at knees Anesthetic complications: no   No notable events documented.   Last Vitals:  Vitals:   12/16/22 0140 12/16/22 0730  BP: 139/65 134/70  Pulse: 70 60  Resp: 16 16  Temp: 37 C 36.8 C  SpO2: 98% 98%    Last Pain:  Vitals:   12/16/22 0730  TempSrc: Oral  PainSc: 0-No pain                 Lia Foyer

## 2022-12-16 NOTE — Plan of Care (Signed)
  Problem: Pain Management: Goal: Pain level will decrease with appropriate interventions Outcome: Progressing   Problem: Skin Integrity: Goal: Will show signs of wound healing Outcome: Progressing   

## 2022-12-16 NOTE — Progress Notes (Signed)
Physical Therapy Treatment Patient Details Name: Gregory [name] MRN: [id number] DOB: 08-27-1932 Today's Date: 12/16/2022   History of Present Illness 89 yo M with degenerative joint disease of the left knee and is s/p elective L TKA 4/2.  PMH includes HTN, CAD, MI, lumbar DDD, and R RTC repair.  Pt also reported a history of R partial knee replacement.    PT Comments    Pt pleasant and motivated t/o PT session, however he did consistently show some impulsivity and confusion during HEP training, positional education, and during mobility.  Pt ultimately has good strength, ROM and mobility t/o the session but despite heavy cuing he did need supervision and excessive VCs to stay on task and insure safety.  Good overall tolerance with VSS and minimal pain.  Spoke with brother who will be able to provide 24/7 assist, educated on some safety points/concerns - plan to f/u with in-person HEP/safety/AD review this afternoon before planned d/c.   Recommendations for follow up therapy are one component of a multi-disciplinary discharge planning process, led by the attending physician.  Recommendations may be updated based on patient status, additional functional criteria and insurance authorization.  Follow Up Recommendations  Can patient physically be transported by private vehicle: Yes    Assistance Recommended at Discharge Frequent or constant Supervision/Assistance  Patient can return home with the following A little help with walking and/or transfers;A little help with bathing/dressing/bathroom;Assistance with cooking/housework;Assist for transportation;Help with stairs or ramp for entrance   Equipment Recommendations  Rolling walker (2 wheels);BSC/3in1    Recommendations for Other Services       Precautions / Restrictions Precautions Precautions: Fall Precaution Comments: Pt with consistent impulsivity Restrictions Weight Bearing Restrictions: Yes LLE Weight Bearing: Weight bearing as  tolerated     Mobility  Bed Mobility Overal bed mobility: Modified Independent             General bed mobility comments: able to get up to sitting and back to supine w/o assist, minimal cuing for sequencing    Transfers Overall transfer level: Needs assistance Equipment used: Rolling walker (2 wheels) Transfers: Sit to/from Stand Sit to Stand: Min guard           General transfer comment: Pt repeatedly attempted to stand (depsite repeated cuing) before PT placed RW.  Then he failed on a few attempts to rise until PT cued for appropriate hand placement/use and sequencing - ultimately he stood w/o assist just showed poor overall awareness during the process.    Ambulation/Gait Ambulation/Gait assistance: Supervision Gait Distance (Feet): 225 Feet Assistive device: Rolling walker (2 wheels)         General Gait Details: Pt generally did well with prolonged bout of ambulation but did need consistent directional/situational cuing as well as AD use/positioning.  Once he got going he was able to maintain a consistent and appropriate cadence showing good L WBing/strength but again general safety awareness was lacking t/o the effort.   Stairs Stairs: Yes Stairs assistance: Min guard Stair Management: One rail Right, Forwards Number of Stairs: 4 General stair comments: Pt did figure out appropriate seqencing with cuing but not explicit directions.  reports post on R with "not very steady handles (?)"  so simulated with single UE use - pt did not need direct assist to ascend/descend the steps.   Wheelchair Mobility    Modified Rankin (Stroke Patients Only)       Balance Overall balance assessment: Needs assistance Sitting-balance support: Feet supported Sitting balance-Leahy Scale:  Normal     Standing balance support: Bilateral upper extremity supported, During functional activity, Reliant on assistive device for balance Standing balance-Leahy Scale: Fair Standing  balance comment: Pt with no LOBs, again general poor awareness w/o overt safety issues apart from confusion-catylized stagger stepping during directional cuing and impulsivity                            Cognition Arousal/Alertness: Awake/alert Behavior During Therapy: Impulsive Overall Cognitive Status: Difficult to assess                                 General Comments: Min difficulty following commands at times, required increased multi-modal cuing and time to process        Exercises Total Joint Exercises Ankle Circles/Pumps: AROM, 15 reps Quad Sets: Strengthening, 10 reps Short Arc Quad: Strengthening, 10 reps Heel Slides: AROM, 10 reps (with resisted leg ext) Hip ABduction/ADduction: Strengthening, 10 reps Straight Leg Raises: AROM, 10 reps Knee Flexion: PROM, 5 reps Goniometric ROM: 0-89    General Comments General comments (skin integrity, edema, etc.): Pt phyiscally doing quite well, impulsivity and confusion are biggest issues.  Spoke with brother/caregiver and clarified some concerns.      Pertinent Vitals/Pain Pain Assessment Pain Assessment: Faces Faces Pain Scale: Hurts a little bit    Home Living                          Prior Function            PT Goals (current goals can now be found in the care plan section) Progress towards PT goals: Progressing toward goals    Frequency    BID      PT Plan Current plan remains appropriate    Co-evaluation              AM-PAC PT "6 Clicks" Mobility   Outcome Measure  Help needed turning from your back to your side while in a flat bed without using bedrails?: None Help needed moving from lying on your back to sitting on the side of a flat bed without using bedrails?: None Help needed moving to and from a bed to a chair (including a wheelchair)?: A Little Help needed standing up from a chair using your arms (e.g., wheelchair or bedside chair)?: A Little Help  needed to walk in hospital room?: A Little Help needed climbing 3-5 steps with a railing? : A Little 6 Click Score: 20    End of Session Equipment Utilized During Treatment: Gait belt Activity Tolerance: Patient tolerated treatment well Patient left: in bed;with call bell/phone within reach;with bed alarm set;Other (comment) (polar care, towel roll at heel) Nurse Communication: Mobility status PT Visit Diagnosis: Unsteadiness on feet (R26.81);History of falling (Z91.81);Other abnormalities of gait and mobility (R26.89);Muscle weakness (generalized) (M62.81)     Time: [id number] PT Time Calculation (min) (ACUTE ONLY): 40 min  Charges:  $Gait Training: 8-22 mins $Therapeutic Exercise: 8-22 mins $Therapeutic Activity: 8-22 mins                     Kreg Shropshire, DPT 12/16/2022, 9:58 AM

## 2022-12-16 NOTE — Progress Notes (Signed)
Physical Therapy Treatment Patient Details Name: Gregory [name] MRN: [id number] DOB: [birth year] Today's Date: 12/16/2022   History of Present Illness 89 yo M with degenerative joint disease of the left knee and is s/p elective L TKA 4/2.  PMH includes HTN, CAD, MI, lumbar DDD, and R RTC repair.  Pt also reported a history of R partial knee replacement.    PT Comments    Pt's brother present t/o session, educated him regarding safety considerations around the home and given pt's current impulsivity and inability to consistently follow instructions this appeared to overwhelm him.  Pt consistently unable to follow basic instruction despite repeated and multi-modal cuing t/o the entire session.  Pt continues to display good strength, ROM, activity tolerance, etc but with poor safety and general awareness.  Pt will need close and competent supervision to manage at home, brother unable to really say how close to baseline the pt's mentation is.      Recommendations for follow up therapy are one component of a multi-disciplinary discharge planning process, led by the attending physician.  Recommendations may be updated based on patient status, additional functional criteria and insurance authorization.  Follow Up Recommendations  Can patient physically be transported by private vehicle: Yes    Assistance Recommended at Discharge Frequent or constant Supervision/Assistance (pt will need close supervision given current lack of safety awareness)  Patient can return home with the following A little help with walking and/or transfers;A little help with bathing/dressing/bathroom;Assistance with cooking/housework;Assist for transportation;Help with stairs or ramp for entrance   Equipment Recommendations  Rolling walker (2 wheels);BSC/3in1    Recommendations for Other Services       Precautions / Restrictions Precautions Precautions: Fall Precaution Comments: Pt with consistent  impulsivity Restrictions Weight Bearing Restrictions: No LLE Weight Bearing: Weight bearing as tolerated     Mobility  Bed Mobility Overal bed mobility: Modified Independent             General bed mobility comments: able to get up to sitting and back to supine w/o assist, minimal cuing for sequencing    Transfers Overall transfer level: Needs assistance Equipment used: Rolling walker (2 wheels) Transfers: Sit to/from Stand Sit to Stand: Supervision           General transfer comment: Pt again repeatedly trying to stand w/o RW despite  repeated and explicit reminders to do so.  Pt able to rise easily from a physical but showed little to no safety awareness    Ambulation/Gait Ambulation/Gait assistance: Supervision Gait Distance (Feet): 250 Feet Assistive device: Rolling walker (2 wheels)         General Gait Details: Pt able to ambulate, stop, turn, etc with confidence - at times too much confidence as he'd get himself outside the walker or become easily distracted and try going w/o the walker despite (heavy cuing) or make other decisions in contradiction to real-time cuing.  Pt with no LOBs or staggering, but showed poor safety and general awareness t/o the effort.   Stairs Stairs: Yes Stairs assistance: Min guard Stair Management: One rail Right, Step to pattern, Forwards Number of Stairs: 4 General stair comments: With brother present discussed, reviewed, performed and repeated all cuing/education.  Pt able to go up/down steps with relative ease but did catch heel on on step coming down and needed heavy UE reliance on the rail - on making him try again he was able to clear heel and continue w/o issue - brother voices undertanding, difficult to know  how much pt is able to integrate cues and safety education.   Wheelchair Mobility    Modified Rankin (Stroke Patients Only)       Balance Overall balance assessment: Needs assistance Sitting-balance support:  Feet supported Sitting balance-Leahy Scale: Normal     Standing balance support: Bilateral upper extremity supported, During functional activity, Reliant on assistive device for balance Standing balance-Leahy Scale: Good Standing balance comment: pt's pure phyiscal balance is appropriate, however his penchant to turn loose of the walker or make impulsive/ quick (unnecessary turns, etc) movements give some level of concern.  Reiterated to brother that he should be with pt every time he gets up when they initially go home -  he voices understanding.                            Cognition Arousal/Alertness: Awake/alert Behavior During Therapy: Impulsive, Restless Overall Cognitive Status: Impaired/Different from baseline                                 General Comments: consistent difficulty following commands, required increased multi-modal cuing and time to process - brother present and had difficult giving an answer about how close to normal this is for the pt.  Pt showed poor overall awareness        Exercises Total Joint Exercises Ankle Circles/Pumps: AROM, 15 reps Quad Sets: Strengthening, 10 reps Short Arc Quad: Strengthening, 10 reps Heel Slides: AROM, 10 reps (with resisted leg ext) Hip ABduction/ADduction: Strengthening, 10 reps Straight Leg Raises: AROM, 10 reps Knee Flexion: PROM, 5 reps Goniometric ROM: 0-89    General Comments General comments (skin integrity, edema, etc.): Pt demonstrates good flexibility; able to lean forward at hips and touch toes from seated position.      Pertinent Vitals/Pain Pain Assessment Pain Assessment: No/denies pain ("feels good") Faces Pain Scale: Hurts a little bit    Home Living Family/patient expects to be discharged to:: Private residence Living Arrangements: Other relatives (brother Lynnae Sandhoff) Available Help at Discharge: Family;Available 24 hours/day Type of Home: House Home Access: Stairs to  enter Entrance Stairs-Rails: None Entrance Stairs-Number of Steps: 2   Home Layout: One level Home Equipment:  (RW and BSC in post-op ready for pt to d/c home)      Prior Function            PT Goals (current goals can now be found in the care plan section) Progress towards PT goals: Progressing toward goals    Frequency    BID      PT Plan Current plan remains appropriate    Co-evaluation              AM-PAC PT "6 Clicks" Mobility   Outcome Measure  Help needed turning from your back to your side while in a flat bed without using bedrails?: None Help needed moving from lying on your back to sitting on the side of a flat bed without using bedrails?: None Help needed moving to and from a bed to a chair (including a wheelchair)?: A Little Help needed standing up from a chair using your arms (e.g., wheelchair or bedside chair)?: A Little Help needed to walk in hospital room?: A Little Help needed climbing 3-5 steps with a railing? : A Little 6 Click Score: 20    End of Session Equipment Utilized During Treatment: Gait belt Activity Tolerance: Patient tolerated  treatment well Patient left: in chair;with call bell/phone within reach;with nursing/sitter in room;with family/visitor present Nurse Communication: Mobility status PT Visit Diagnosis: Unsteadiness on feet (R26.81);History of falling (Z91.81);Other abnormalities of gait and mobility (R26.89);Muscle weakness (generalized) (M62.81)     Time: [id number] PT Time Calculation (min) (ACUTE ONLY): 48 min  Charges:  $Gait Training: 8-22 mins $Therapeutic Exercise: 8-22 mins $Therapeutic Activity: 8-22 mins                     Kreg Shropshire, DPT 12/16/2022, 2:30 PM

## 2022-12-16 NOTE — Evaluation (Signed)
Occupational Therapy Evaluation Patient Details Name: Gregory [name] MRN: [id number] DOB: [birth year] Today's Date: 12/16/2022   History of Present Illness 89 yo M with degenerative joint disease of the left knee and is s/p elective L TKA 4/2.  PMH includes HTN, CAD, MI, lumbar DDD, and R RTC repair.  Pt also reported a history of R partial knee replacement.   Clinical Impression   Patient received for OT evaluation. See flowsheet below for details of function. Generally, patient requiring supervision/cues with RW for functional mobility, and supervision/cues for ADLs. Pt demonstrates extremely poor cognition (see below); significant concerns about safety at home since 61 y/o twin brother is his only caregiver. Patient will benefit from continued OT while in acute care.      Recommendations for follow up therapy are one component of a multi-disciplinary discharge planning process, led by the attending physician.  Recommendations may be updated based on patient status, additional functional criteria and insurance authorization.   Assistance Recommended at Discharge Frequent or constant Supervision/Assistance  Patient can return home with the following A little help with walking and/or transfers;A little help with bathing/dressing/bathroom;Assistance with cooking/housework;Direct supervision/assist for medications management;Direct supervision/assist for financial management;Assist for transportation;Help with stairs or ramp for entrance;Other (comment) (supervision 2/2 poor cognition)    Functional Status Assessment  Patient has had a recent decline in their functional status and demonstrates the ability to make significant improvements in function in a reasonable and predictable amount of time.  Equipment Recommendations  BSC/3in1    Recommendations for Other Services       Precautions / Restrictions Precautions Precautions: Fall Precaution Comments: Pt with consistent  impulsivity Restrictions Weight Bearing Restrictions: No LLE Weight Bearing: Weight bearing as tolerated      Mobility Bed Mobility               General bed mobility comments: Not witnessed today; pt seated in recliner chair when OT arrived.    Transfers Overall transfer level: Needs assistance Equipment used: Rolling walker (2 wheels) Transfers: Sit to/from Stand Sit to Stand: Supervision           General transfer comment: Needing cues to use RW, as pt standing without RW unless cued to do so.      Balance Overall balance assessment: Needs assistance Sitting-balance support: Feet supported Sitting balance-Leahy Scale: Normal     Standing balance support: Bilateral upper extremity supported, During functional activity, Reliant on assistive device for balance Standing balance-Leahy Scale: Fair Standing balance comment: poor safety awareness with RW                           ADL either performed or assessed with clinical judgement   ADL Overall ADL's : Needs assistance/impaired                                       General ADL Comments: Pt reluctantly following OT directions for walking to sink in hallway to wash hands; needing cue to use RW; no loss of balance during mobility; close supervision for safety; pt needing cue to use soap, then having difficulty figuring out to sequence the rest of the task. Walked to the bathroom and stated he needed to use the toilet; would not allow OT into bathroom for supervision; OT stayed outside the door with door cracked; pt witnessed getting up without RW and walking  a few feet to sink; not sequencing through task well (just using water, no soap). Pt becoming increasingly more irritated, stating he has lots of things to do at home and ready to go; pt only briefly sitting in recliner, then standing again and saying he needs to go; brother unsuccessful in redirecting pt; OT went to nursing station to find  RN for assistance and to discuss safety issues at d/c; pt left in care of brother.     Vision         Perception     Praxis      Pertinent Vitals/Pain Pain Assessment Pain Assessment: No/denies pain     Hand Dominance     Extremity/Trunk Assessment Upper Extremity Assessment Upper Extremity Assessment: Overall WFL for tasks assessed   Lower Extremity Assessment Lower Extremity Assessment: Overall WFL for tasks assessed       Communication Communication Communication: No difficulties   Cognition Arousal/Alertness: Awake/alert Behavior During Therapy: Agitated, Restless, Impulsive Overall Cognitive Status: Impaired/Different from baseline Area of Impairment: Memory, Attention, Following commands, Safety/judgement                               General Comments: Pt able to follow one-step commands. Is very irritated during OT session, perseverative on going home, not open to safety recommendations, very poor safety awareness and use of RW safely; getting up without RW, not following instructions from brother either. Unable to state the date today. Impulsive/quick movements. Brother stating pt is not normally "like this", but does not give much detail about home cognition/affect. OT has significant concerns about pt's safety at home; expressed these to brother and stressed the need for 24/7 supervision and constant safety cues (which pt does not appear to be following at this time).     General Comments  Pt demonstrates good flexibility; able to lean forward at hips and touch toes from seated position.    Exercises     Shoulder Instructions      Home Living Family/patient expects to be discharged to:: Private residence Living Arrangements: Other relatives (brother Lynnae Sandhoff) Available Help at Discharge: Family;Available 24 hours/day Type of Home: House Home Access: Stairs to enter [employer] of Steps: 2 Entrance Stairs-Rails: None Home Layout:  One level         Biochemist, clinical: Standard     Home Equipment:  (RW and BSC in post-op ready for pt to d/c home)          Prior Functioning/Environment Prior Level of Function : Independent/Modified Independent;History of Falls (last six months)             Mobility Comments: Per PT note: Ind amb very limited community distances without an AD with PRN SPC use with cane borrowed from a friend, one fall in the last 6 months ADLs Comments: Ind with ADLs        OT Problem List: Decreased cognition;Decreased safety awareness;Decreased knowledge of use of DME or AE      OT Treatment/Interventions: Self-care/ADL training;Therapeutic exercise;DME and/or AE instruction;Therapeutic activities;Cognitive remediation/compensation;Patient/family education    OT Goals(Current goals can be found in the care plan section) Acute Rehab OT Goals Patient Stated Goal: go home right now OT Goal Formulation: With family Time For Goal Achievement: 12/30/22 Potential to Achieve Goals: Good ADL Goals Pt Will Perform Lower Body Bathing: with modified independence;sit to/from stand Additional ADL Goal #1: Patient will demonstrate sequencing through ADL tasks without cues  using LRAD MOD (I) by d/c.  OT Frequency: Min 2X/week    Co-evaluation              AM-PAC OT "6 Clicks" Daily Activity     Outcome Measure Help from another person eating meals?: None Help from another person taking care of personal grooming?: A Little Help from another person toileting, which includes using toliet, bedpan, or urinal?: A Little Help from another person bathing (including washing, rinsing, drying)?: A Little Help from another person to put on and taking off regular upper body clothing?: None Help from another person to put on and taking off regular lower body clothing?: A Little 6 Click Score: 20   End of Session Equipment Utilized During Treatment: Rolling walker (2 wheels) Nurse Communication:  Mobility status;Precautions;Other (comment) (pt's level of agitation)  Activity Tolerance: Treatment limited secondary to agitation Patient left: in chair;with family/visitor present (RN going to check on patient)  OT Visit Diagnosis: Unsteadiness on feet (R26.81);Other (comment) (cognitive deficit)                Time: 1325-1340 OT Time Calculation (min): 15 min Charges:  OT General Charges $OT Visit: 1 Visit OT Evaluation $OT Eval Moderate Complexity: [place], MS, OTR/L  Vania Rea 12/16/2022, 2:22 PM

## 2022-12-16 NOTE — Progress Notes (Signed)
Patient is not able to walk the distance required to go the bathroom, or he/she is unable to safely negotiate stairs required to access the bathroom.  A 3in1 BSC will alleviate this problem  

## 2022-12-17 ENCOUNTER — Other Ambulatory Visit (HOSPITAL_COMMUNITY): Payer: Self-pay

## 2022-12-17 DIAGNOSIS — Z96651 Presence of right artificial knee joint: Secondary | ICD-10-CM | POA: Diagnosis present

## 2022-12-17 DIAGNOSIS — Z7982 Long term (current) use of aspirin: Secondary | ICD-10-CM | POA: Diagnosis not present

## 2022-12-17 DIAGNOSIS — I10 Essential (primary) hypertension: Secondary | ICD-10-CM | POA: Diagnosis present

## 2022-12-17 DIAGNOSIS — Z800 Family history of malignant neoplasm of digestive organs: Secondary | ICD-10-CM | POA: Diagnosis not present

## 2022-12-17 DIAGNOSIS — M1712 Unilateral primary osteoarthritis, left knee: Secondary | ICD-10-CM | POA: Diagnosis present

## 2022-12-17 DIAGNOSIS — E782 Mixed hyperlipidemia: Secondary | ICD-10-CM | POA: Diagnosis present

## 2022-12-17 DIAGNOSIS — Z951 Presence of aortocoronary bypass graft: Secondary | ICD-10-CM | POA: Diagnosis not present

## 2022-12-17 DIAGNOSIS — I252 Old myocardial infarction: Secondary | ICD-10-CM | POA: Diagnosis not present

## 2022-12-17 DIAGNOSIS — Z96659 Presence of unspecified artificial knee joint: Secondary | ICD-10-CM

## 2022-12-17 DIAGNOSIS — I420 Dilated cardiomyopathy: Secondary | ICD-10-CM | POA: Diagnosis present

## 2022-12-17 DIAGNOSIS — Z79899 Other long term (current) drug therapy: Secondary | ICD-10-CM | POA: Diagnosis not present

## 2022-12-17 DIAGNOSIS — I2510 Atherosclerotic heart disease of native coronary artery without angina pectoris: Secondary | ICD-10-CM | POA: Diagnosis present

## 2022-12-17 DIAGNOSIS — Z811 Family history of alcohol abuse and dependence: Secondary | ICD-10-CM | POA: Diagnosis not present

## 2022-12-17 DIAGNOSIS — Z8249 Family history of ischemic heart disease and other diseases of the circulatory system: Secondary | ICD-10-CM | POA: Diagnosis not present

## 2022-12-17 MED ORDER — POTASSIUM CHLORIDE CRYS ER 20 MEQ PO TBCR: 40.0000 meq | EXTENDED_RELEASE_TABLET | Freq: Once | ORAL | Status: DC

## 2022-12-17 MED ORDER — HALOPERIDOL LACTATE 5 MG/ML IJ SOLN
2.0000 mg | Freq: Four times a day (QID) | INTRAMUSCULAR | Status: DC | PRN
  Administered 2022-12-17: 2 mg via INTRAVENOUS

## 2022-12-17 MED ORDER — LORAZEPAM 2 MG/ML IJ SOLN
INTRAMUSCULAR | Status: AC
  Filled 2022-12-17: qty 1

## 2022-12-17 MED ORDER — LORAZEPAM 2 MG/ML IJ SOLN: 1.0000 mg | Freq: Four times a day (QID) | INTRAMUSCULAR | Status: DC | PRN

## 2022-12-17 MED ORDER — LORAZEPAM 2 MG/ML IJ SOLN
1.0000 mg | Freq: Four times a day (QID) | INTRAMUSCULAR | Status: DC
  Administered 2022-12-17 (×2): 1 mg via INTRAMUSCULAR
  Filled 2022-12-17: qty 1

## 2022-12-17 MED ORDER — HALOPERIDOL 2 MG PO TABS: 2.0000 mg | ORAL_TABLET | Freq: Four times a day (QID) | ORAL | Status: DC | PRN

## 2022-12-17 NOTE — Progress Notes (Signed)
Subjective: 2 Days Post-Op Procedure(s) (LRB): TOTAL KNEE ARTHROPLASTY - RNFA (Left) Patient reports pain as mild.  Patient is very confused this morning.  Agitated during the night.  Given Haldol intramuscular for agitation.  Multiple nurses in the room to control him. Patient is well, and has had no acute complaints or problems Plan is to go Home versus rehab after hospital stay.  Patient has good family support.  The patient was extremely confused this morning. Negative for chest pain and shortness of breath Fever: no Gastrointestinal: Negative for nausea and vomiting  Objective: Vital signs in last 24 hours: Temp:  [98.2 F (36.8 C)-99 F (37.2 C)] 98.2 F (36.8 C) (04/03 1806) Pulse Rate:  [60-87] 87 (04/03 1806) Resp:  [16-20] 20 (04/03 1806) BP: (134-155)/(70-115) 151/115 (04/03 1806) SpO2:  [94 %-98 %] 97 % (04/03 1806)  Intake/Output from previous day:  Intake/Output Summary (Last 24 hours) at 12/17/2022 0654 Last data filed at 12/16/2022 2140 Gross per 24 hour  Intake 1250 ml  Output 100 ml  Net 1150 ml    Intake/Output this shift: Total I/O In: 398.8 [I.V.:398.8] Out: -   Labs: Recent Labs    12/16/22 0636  HGB 12.3*   Recent Labs    12/16/22 0636  WBC 12.1*  RBC 4.09*  HCT 36.1*  PLT 181   Recent Labs    12/16/22 0636  NA 137  K 3.2*  CL 108  CO2 22  BUN 22  CREATININE 0.82  GLUCOSE 169*  CALCIUM 8.7*   No results for input(s): "LABPT", "INR" in the last 72 hours.   EXAM General - Patient is Alert and Confused Extremity - Neurovascular intact Sensation intact distally Dorsiflexion/Plantar flexion intact Compartment soft Full extension to 120 degrees of flexion. Dressing/Incision - clean, dry, no drainage Motor Function - intact, moving foot and toes well on exam.  Ambulated 250 feet with physical therapy.  Able to straight leg raise independently.  Past Medical History:  Diagnosis Date   Arthritis    CAD (coronary artery disease)  08/05/2005   a.) LHC 08/05/2005: 20% pLM, 60% pLAD, 80% mLAD, 100% OM1, 40% pRCA, 100% mRCA -- CVTS consult; b.) 4v CABG 08/07/2005   Carotid stenosis    DDD (degenerative disc disease), lumbar    Diastolic dysfunction    a.) TTE 08/30/2012: EF 45%, lat HK, triv MR, G1DD; b.) TTE 12/10/2016: EF 40%, mild LAE, mild panvalvular regurg; c.) TTE 10/05/2019: EF 50%, mild BAE, mild RVE, triv AR/PR, mild MR/TR, G1DD   Dilated cardiomyopathy    a.) TTE 08/30/2012: EF 45%; b.) TTE 12/10/2016: EF 40%; c.) MPI 08/22/2019: EF 57%; d.) TTE 10/05/2019: EF 50%   DOE (dyspnea on exertion)    Headache    History of bilateral cataract extraction    Hyperlipidemia    Hypertension    Myocardial infarction 07/2005   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   Weight as of this encounter: 79.2 kg. Advance diet Up with therapy D/C IV fluids Discharge home with home health versus possible skilled nursing, depending on mental status.  DVT Prophylaxis - Foot Pumps, TED hose, and  Eliquis Weight-Bearing as tolerated to left leg  Reche Dixon, PA-C Orthopaedic Surgery 12/17/2022, 6:54 AM

## 2022-12-17 NOTE — Progress Notes (Signed)
Pt up at 0143 agitated peeing on floor, trash can and into towels. Confused and unable to be directed. Several staff attempted with no success. PT thinks he is at home and that staff have invaded his house. He is looking for his brother everywhere. MD notified. Security called and Ativan given 1 MG IM. Pt stayed awake and agitated till 0300 then went to sleep. Resting at this time. Bed alarm in use.

## 2022-12-17 NOTE — Progress Notes (Signed)
Security in room upon arrival due to patient being combative with nursing staff. Patient medicated by night shift RN. No sitter available currently. No PIV established, medications ordered IM.  Discharge order placed?

## 2022-12-17 NOTE — Progress Notes (Signed)
PT Cancellation Note  Patient Details Name: Gregory [name] MRN: [id number] DOB: December 22, 1931   Cancelled Treatment:    Reason Eval/Treat Not Completed: Medical issues which prohibited therapy (Discussed with nurse several times today. Patient is too agitated, unable to follow commands for safe  participation with PT intervention at this time. Will follow up next date.)  Minna Merritts, PT, MPT  Percell Locus 12/17/2022, 2:54 PM

## 2022-12-17 NOTE — Plan of Care (Signed)

## 2022-12-18 LAB — BASIC METABOLIC PANEL
Anion gap: 10 (ref 5–15)
BUN: 24 mg/dL — ABNORMAL HIGH (ref 8–23)
CO2: 22 mmol/L (ref 22–32)
Calcium: 9.1 mg/dL (ref 8.9–10.3)
Chloride: 106 mmol/L (ref 98–111)
Creatinine, Ser: 0.68 mg/dL (ref 0.61–1.24)
GFR, Estimated: >60 mL/min (ref >60)
Glucose, Bld: 115 mg/dL — ABNORMAL HIGH (ref 70–99)
Potassium: 3.8 mmol/L (ref 3.5–5.1)
Sodium: 138 mmol/L (ref 135–145)

## 2022-12-18 NOTE — Plan of Care (Signed)

## 2022-12-18 NOTE — NC FL2 (Signed)
[place] MEDICAID FL2 LEVEL OF CARE FORM     IDENTIFICATION  Patient Name: HELMUTH RAULS Birthdate: [birth year] Sex: male Admission Date (Current Location): 12/15/2022  [facility] and [place] Number:  Chiropodist and Address:  [facility], [address], [place], [place] [address]      Provider Number: [id number]  Attending Physician Name and Address:  Christena Flake, MD  Relative Name and Phone Number:  Ralphy Kalich [phone]    Current Level of Care: Hospital Recommended Level of Care: Skilled Nursing Facility Prior Approval Number:    Date Approved/Denied:   PASRR Number: [id number]  Discharge Plan: SNF    Current Diagnoses: Patient Active Problem List   Diagnosis Date Noted   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   Status post total knee replacement using cement, 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    Orientation RESPIRATION BLADDER Height & Weight     Self  Normal Incontinent Weight: 79.2 kg Height:  5\' 9"  (175.3 cm)  BEHAVIORAL SYMPTOMS/MOOD NEUROLOGICAL BOWEL NUTRITION STATUS      Incontinent  (See Discharge Summary)  AMBULATORY STATUS COMMUNICATION OF NEEDS Skin   Extensive Assist Verbally Normal                       Personal Care Assistance Level of Assistance  Bathing, Feeding, Dressing Bathing Assistance: Limited assistance Feeding assistance: Limited assistance Dressing Assistance: Limited assistance     Functional Limitations Info  Sight, Hearing, Speech Sight Info: Adequate Hearing Info: Adequate Speech Info: Adequate    SPECIAL CARE FACTORS FREQUENCY  PT (By licensed PT), OT (By licensed OT)     PT Frequency: 5x weekly OT Frequency: 5x weekly            Contractures Contractures Info: Not present    Additional Factors Info  Code Status, Allergies Code Status Info: Full Code Allergies Info: Hydrocodone           Current Medications (12/18/2022):  This is the current hospital active medication  list Current Facility-Administered Medications  Medication Dose Route Frequency Provider Last Rate Last Admin   0.9 %  sodium chloride infusion   Intravenous Continuous Poggi, Excell Seltzer, MD   Stopped at 12/16/22 1700   acetaminophen (TYLENOL) tablet 325-650 mg  325-650 mg Oral Q6H PRN Christena Flake, MD   650 mg at 12/17/22 2229   apixaban (ELIQUIS) tablet 2.5 mg  2.5 mg Oral BID Christena Flake, MD   2.5 mg at 12/18/22 4536   bisacodyl (DULCOLAX) suppository 10 mg  10 mg Rectal Daily PRN Christena Flake, MD   10 mg at 12/18/22 1034   diphenhydrAMINE (BENADRYL) 12.5 MG/5ML elixir 12.5-25 mg  12.5-25 mg Oral Q4H PRN Poggi, Excell Seltzer, MD       docusate sodium (COLACE) capsule 100 mg  100 mg Oral BID Christena Flake, MD   100 mg at 12/18/22 4680   fenofibrate tablet 160 mg  160 mg Oral BH-q7a Poggi, Excell Seltzer, MD   160 mg at 12/18/22 0954   haloperidol (HALDOL) tablet 2 mg  2 mg Oral Q6H PRN Poggi, Excell Seltzer, MD       Or   haloperidol lactate (HALDOL) injection 2 mg  2 mg Intravenous Q6H PRN Poggi, Excell Seltzer, MD   2 mg at 12/17/22 1429   HYDROmorphone (DILAUDID) injection 0.25-0.5 mg  0.25-0.5 mg Intravenous Q4H PRN Poggi, Excell Seltzer, MD       lisinopril (ZESTRIL)  tablet 20 mg  20 mg Oral BH-q7a Poggi, Excell SeltzerJohn J, MD   20 mg at 12/18/22 0654   LORazepam (ATIVAN) injection 1 mg  1 mg Intravenous Q6H PRN Poggi, Excell SeltzerJohn J, MD       magnesium hydroxide (MILK OF MAGNESIA) suspension 30 mL  30 mL Oral Daily PRN Poggi, Excell SeltzerJohn J, MD       metoCLOPramide (REGLAN) tablet 5-10 mg  5-10 mg Oral Q8H PRN Poggi, Excell SeltzerJohn J, MD       Or   metoCLOPramide (REGLAN) injection 5-10 mg  5-10 mg Intravenous Q8H PRN Poggi, Excell SeltzerJohn J, MD       metoprolol succinate (TOPROL-XL) 24 hr tablet 12.5 mg  12.5 mg Oral Daily Poggi, Excell SeltzerJohn J, MD   12.5 mg at 12/18/22 [id number]   multivitamin with minerals tablet 1 tablet  1 tablet Oral Daily Poggi, Excell SeltzerJohn J, MD   1 tablet at 12/18/22 0954   ondansetron (ZOFRAN) tablet 4 mg  4 mg Oral Q6H PRN Poggi, Excell SeltzerJohn J, MD       Or    ondansetron (ZOFRAN) injection 4 mg  4 mg Intravenous Q6H PRN Poggi, Excell SeltzerJohn J, MD       oxyCODONE (Oxy IR/ROXICODONE) immediate release tablet 5-10 mg  5-10 mg Oral Q4H PRN Poggi, Excell SeltzerJohn J, MD       potassium chloride SA (KLOR-CON M) CR tablet 40 mEq  40 mEq Oral Once Mundy, Todd, PA-C       pravastatin (PRAVACHOL) tablet 80 mg  80 mg Oral QPM Poggi, Excell SeltzerJohn J, MD   80 mg at 12/15/22 1800   psyllium (HYDROCIL/METAMUCIL) 1 packet  1 packet Oral Daily Poggi, Excell SeltzerJohn J, MD   1 packet at 12/18/22 0954   sodium phosphate (FLEET) 7-19 GM/118ML enema 1 enema  1 enema Rectal Once PRN Poggi, Excell SeltzerJohn J, MD       traMADol Janean Sark(ULTRAM) tablet 50 mg  50 mg Oral Q6H PRN Poggi, Excell SeltzerJohn J, MD         Discharge Medications: Please see discharge summary for a list of discharge medications.  Relevant Imaging Results:  Relevant Lab Results:   Additional Information [id number]  Garret ReddishFaye A Eros Montour, RN

## 2022-12-18 NOTE — Progress Notes (Signed)
Subjective: 3 Days Post-Op Procedure(s) (LRB): TOTAL KNEE ARTHROPLASTY - RNFA (Left) Patient reports pain as mild.  Confusion is much improved.  Able to have conversation this morning. Patient is well, and has had no acute complaints or problems Plan is to go Home versus rehab after hospital stay.  Patient has good family support.  The patient is doing much better with his confusion.  No agitation. Negative for chest pain and shortness of breath Fever: no Gastrointestinal: Negative for nausea and vomiting  Objective: Vital signs in last 24 hours: Temp:  [97.8 F (36.6 C)-98.7 F (37.1 C)] 97.8 F (36.6 C) (04/05 0056) Pulse Rate:  [95-106] 96 (04/05 0652) Resp:  [16-18] 18 (04/05 0056) BP: (151-168)/(85-98) 168/98 (04/05 0652) SpO2:  [95 %-98 %] 95 % (04/05 0056)  Intake/Output from previous day:  Intake/Output Summary (Last 24 hours) at 12/18/2022 0711 Last data filed at 12/18/2022 0538 Gross per 24 hour  Intake --  Output 950 ml  Net -950 ml    Intake/Output this shift: No intake/output data recorded.  Labs: Recent Labs    12/16/22 0636  HGB 12.3*   Recent Labs    12/16/22 0636  WBC 12.1*  RBC 4.09*  HCT 36.1*  PLT 181   Recent Labs    12/16/22 0636 12/18/22 0452  NA 137 138  K 3.2* 3.8  CL 108 106  CO2 22 22  BUN 22 24*  CREATININE 0.82 0.68  GLUCOSE 169* 115*  CALCIUM 8.7* 9.1   No results for input(s): "LABPT", "INR" in the last 72 hours.   EXAM General - Patient is Alert and Confused Extremity - Neurovascular intact Sensation intact distally Dorsiflexion/Plantar flexion intact Compartment soft Full extension to 120 degrees of flexion. Dressing/Incision - clean, dry, no drainage Motor Function - intact, moving foot and toes well on exam.  Ambulated 250 feet with physical therapy.  Able to straight leg raise independently.  Past Medical History:  Diagnosis Date   Arthritis    CAD (coronary artery disease) 08/05/2005   a.) LHC 08/05/2005:  20% pLM, 60% pLAD, 80% mLAD, 100% OM1, 40% pRCA, 100% mRCA -- CVTS consult; b.) 4v CABG 08/07/2005   Carotid stenosis    DDD (degenerative disc disease), lumbar    Diastolic dysfunction    a.) TTE 08/30/2012: EF 45%, lat HK, triv MR, G1DD; b.) TTE 12/10/2016: EF 40%, mild LAE, mild panvalvular regurg; c.) TTE 10/05/2019: EF 50%, mild BAE, mild RVE, triv AR/PR, mild MR/TR, G1DD   Dilated cardiomyopathy    a.) TTE 08/30/2012: EF 45%; b.) TTE 12/10/2016: EF 40%; c.) MPI 08/22/2019: EF 57%; d.) TTE 10/05/2019: EF 50%   DOE (dyspnea on exertion)    Headache    History of bilateral cataract extraction    Hyperlipidemia    Hypertension    Myocardial infarction 07/2005   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   Weight as of this encounter: 79.2 kg. Advance diet Up with therapy D/C IV fluids Discharge home with home health versus possible skilled nursing, depending on mental status.  DVT Prophylaxis - Foot  Pumps, TED hose, and Eliquis Weight-Bearing as tolerated to left leg  Dedra Skeensodd Kalia Vahey, PA-C Orthopaedic Surgery 12/18/2022, 7:11 AM

## 2022-12-18 NOTE — Progress Notes (Signed)
Occupational Therapy Treatment Patient Details Name: Gregory [name] DOB: November 21, 1931 Today's Date: 12/18/2022   History of present illness 89 yo M with degenerative joint disease of the left knee and is s/p elective L TKA 4/2.  PMH includes HTN, CAD, MI, lumbar DDD, and R RTC repair.  Pt also reported a history of R partial knee replacement.   OT comments  Pt received semi-reclined in bed. Appearing alert, cooperative; willing to work with OT on grooming standing at sink. T/f CGA with RW and lots of cues for safe RW use; pt urinated as soon as he stood; cues for safety to sit back down while clean supplies obtained. See flowsheet below for further details of session. Left seated in recliner, PT in room about to start session, with all needs in reach. Pt's affect much improved from prior session, however memory continues to be a large safety concern. Patient will benefit from continued OT while in acute care.    Recommendations for follow up therapy are one component of a multi-disciplinary discharge planning process, led by the attending physician.  Recommendations may be updated based on patient status, additional functional criteria and insurance authorization.    Assistance Recommended at Discharge Frequent or constant Supervision/Assistance  Patient can return home with the following  A little help with walking and/or transfers;A little help with bathing/dressing/bathroom;Assistance with cooking/housework;Direct supervision/assist for medications management;Direct supervision/assist for financial management;Assist for transportation;Help with stairs or ramp for entrance   Equipment Recommendations  Other (comment) (defer to next venue of care)    Recommendations for Other Services      Precautions / Restrictions Precautions Precautions: Fall Precaution Comments: Pt with consistent impulsivity Restrictions Weight Bearing Restrictions: No LLE Weight Bearing: Weight bearing  as tolerated       Mobility Bed Mobility Overal bed mobility: Needs Assistance Bed Mobility: Supine to Sit     Supine to sit: Supervision          Transfers Overall transfer level: Needs assistance Equipment used: Rolling walker (2 wheels) Transfers: Sit to/from Stand Sit to Stand: Min guard           General transfer comment: Needing strong cues for proper  RW use     Balance Overall balance assessment: Needs assistance Sitting-balance support: No upper extremity supported, Feet supported Sitting balance-Leahy Scale: Good     Standing balance support: Bilateral upper extremity supported Standing balance-Leahy Scale: Fair Standing balance comment: poor safety awareness with RW                           ADL either performed or assessed with clinical judgement   ADL Overall ADL's : Needs assistance/impaired     Grooming: Wash/dry hands;Oral care;Brushing hair;Min guard;Standing Grooming Details (indicate cue type and reason): standing at sink, RW to get there; cues from OT for sequencing from one task to the next.       Lower Body Bathing Details (indicate cue type and reason): anticipate MIN A from seated   Upper Body Dressing Details (indicate cue type and reason): anticipate set up; pt changed gowns today (OT just helped to untie back of gown) Lower Body Dressing: Minimal assistance Lower Body Dressing Details (indicate cue type and reason): Pt able to doff BIL non skid socks (that had urine on them); needing assistance for TED hose;  PT then in room to assist pt with donning BIL TED hose; anticipate pt to need supervision for donning  all LB clothing for safety due to decreased balance and decreased safety awareness.   Toilet Transfer Details (indicate cue type and reason): anticipate need for CGA with RW and safety cues; pt needing strong cues for t/f technique with hands to stand from bed (pt reaching up for BIL hands on RW) and to sit in chair  (needing cues to reach back prior to sitting). Toileting- Clothing Manipulation and Hygiene: Moderate assistance Toileting - Clothing Manipulation Details (indicate cue type and reason): assistance to move gown out of the way, hold balance in standing while pt using urinal; urinary incontinence twice today in standing   Tub/Shower Transfer Details (indicate cue type and reason): anticipate MIN A for balance Functional mobility during ADLs: Rolling walker (2 wheels);Min guard (and gait belt for safety)      Extremity/Trunk Assessment Upper Extremity Assessment Upper Extremity Assessment: Generalized weakness   Lower Extremity Assessment Lower Extremity Assessment: Defer to PT evaluation;LLE deficits/detail LLE Deficits / Details: TKA        Vision       Perception     Praxis      Cognition Arousal/Alertness: Awake/alert Behavior During Therapy: Impulsive Overall Cognitive Status: No family/caregiver present to determine baseline cognitive functioning Area of Impairment: Attention, Memory, Orientation, Following commands, Safety/judgement                 Orientation Level: Person, Situation   Memory: Decreased recall of precautions, Decreased short-term memory Following Commands: Follows one step commands consistently Safety/Judgement: Decreased awareness of safety, Decreased awareness of deficits     General Comments: Oriented generally to the fact that he had knee surgery and is in the hospital; not oriented to the date of his surgery or today's date.  Impulsive, poor safety awareness, poor awareness of body functions (seems unaware that he was urinating when standing, or that he had to urinate some more.)        Exercises      Shoulder Instructions       General Comments Pt affect much improved today, very pleasant, but continues to have memory and safety deficits making him high risk for falls and mediction non-compliance.    Pertinent Vitals/ Pain        Pain Assessment Pain Assessment: No/denies pain  Home Living                                          Prior Functioning/Environment              Frequency  Min 2X/week        Progress Toward Goals  OT Goals(current goals can now be found in the care plan section)  Progress towards OT goals: Progressing toward goals  Acute Rehab OT Goals Patient Stated Goal: Go home OT Goal Formulation: Patient unable to participate in goal setting Time For Goal Achievement: 12/30/22 Potential to Achieve Goals: Good ADL Goals Pt Will Perform Lower Body Bathing: with modified independence;sit to/from stand Additional ADL Goal #1: Patient will demonstrate sequencing through ADL tasks without cues using LRAD MOD (I) by d/c.  Plan Discharge plan needs to be updated    Co-evaluation                 AM-PAC OT "6 Clicks" Daily Activity     Outcome Measure   Help from another person eating meals?: None Help from another person taking  care of personal grooming?: A Little Help from another person toileting, which includes using toliet, bedpan, or urinal?: A Little Help from another person bathing (including washing, rinsing, drying)?: A Little Help from another person to put on and taking off regular upper body clothing?: None Help from another person to put on and taking off regular lower body clothing?: A Little 6 Click Score: 20    End of Session Equipment Utilized During Treatment: Gait belt;Rolling walker (2 wheels)  OT Visit Diagnosis: Unsteadiness on feet (R26.81)   Activity Tolerance Patient tolerated treatment well   Patient Left in chair (with PT in room to start session)   Nurse Communication Mobility status        Time: [id number] OT Time Calculation (min): 23 min  Charges: OT General Charges $OT Visit: 1 Visit OT Treatments $Self Care/Home Management : 23-37 mins  Linward FosterLoy Anne Stark Aguinaga, MS, OTR/L   Alvester MorinLoy  Autry Prust 12/18/2022, 2:54 PM

## 2022-12-18 NOTE — Progress Notes (Signed)
PT Cancellation Note  Patient Details Name: KEDUS DROUHARD MRN: [id number] DOB: Mar 27, 1932   Cancelled Treatment:     Patient received from OT for planned pm PT session.  Plan to assist with removal/replacement of gown, socks and TEDs due to bladder incontinence.  Upon removal of TEDs and L knee ace wrap (planned to re-wrap for optimal coverage), noted marked bruising to L medial knee knee and significant edema to L calf (with significant tenderness to compression).  Additionally, some irritation/pressure areas to posterior calf/knee from wrinkles of ace wrap.  RN called to bedside to visualize and assess knee/calf and post-surgical dressing; RN to communicate to physician for additional review (doppler?), recs for dressing change if needed. TEDs, polar care left off until RN with clarification. Will hold additional mobility at this time and continue to follow/progress as appropriate.   Charnese Federici H. Manson Passey, PT, DPT, NCS 12/18/22, 2:28 PM [phone]

## 2022-12-18 NOTE — TOC Progression Note (Signed)
Transition of Care [facility]) - Progression Note    Patient Details  Name: Gregory [name] MRN: [id number] Date of Birth: 10-08-1931  [facility] CM/SW Contact  Chapman Fitch, RN Phone Number: 12/18/2022, 8:22 AM  Clinical Narrative:     Per PA note patient is doing much better with confusion and no agitation. Will follow to see how patient does today and with PT If there are still concerns about brother being able to manage patient at home will start discussions of private pay placement          Expected Discharge Plan and Services         Expected Discharge Date: 12/17/22                                     Social Determinants of Health (SDOH) Interventions SDOH Screenings   Food Insecurity: No Food Insecurity (12/15/2022)  Housing: Low Risk  (12/15/2022)  Transportation Needs: No Transportation Needs (12/15/2022)  Utilities: Not At Risk (12/15/2022)  Tobacco Use: Low Risk  (12/16/2022)    Readmission Risk Interventions     No data to display

## 2022-12-18 NOTE — TOC Progression Note (Signed)
Transition of Care [facility]) - Progression Note    Patient Details  Name: Gregory [name] MRN: [id number] Date of Birth: Mar 01, 1932  [facility]) CM/SW Contact  Chapman Fitch, RN Phone Number: 12/18/2022, 2:56 PM  Clinical Narrative:      Therapy now recommending SNF Brother Thelma Barge in agreement to bed search Patient states that's fine, however he has intermittent confusion   Lucendia Herrlich with TOC to complete Fl2 and bedsearch     Expected Discharge Plan and Services         Expected Discharge Date: 12/17/22                                     Social Determinants of Health (SDOH) Interventions SDOH Screenings   Food Insecurity: No Food Insecurity (12/15/2022)  Housing: Low Risk  (12/15/2022)  Transportation Needs: No Transportation Needs (12/15/2022)  Utilities: Not At Risk (12/15/2022)  Tobacco Use: Low Risk  (12/16/2022)    Readmission Risk Interventions     No data to display

## 2022-12-18 NOTE — Progress Notes (Signed)
Physical Therapy Treatment Patient Details Name: Leotis Painarl H Crissman MRN: [id number] DOB: 10-Jan-1932 Today's Date: 12/18/2022   History of Present Illness 89 yo M with degenerative joint disease of the left knee and is s/p elective L TKA 4/2.  PMH includes HTN, CAD, MI, lumbar DDD, and R RTC repair.  Pt also reported a history of R partial knee replacement.    PT Comments    Patient much more calm and cooperative this date.  Oriented to basic information, but requires step-by-step cuing for all functional tasks; little to no carry-over of cuing for safety.  Globally confused to anything beyond basic information.  Continues to require min assist +1 for all transfers, gait and stairs; generally unsteady and unsafe, requiring close sup/assist at all times.  Phone call to brother to explore prior level of function, cognition and level of support at home upon discharge.  Per brother, patient was indep with mobility, + driving and indep completing community errands, grocery shopping and managing meals, meds.    Concerned with patient/brother's ability to manage care in home environment.  Given level of cognitive impairment noted in current state, coupled with need for consistent cuing/physical assist with mobility, feel patient would benefit from more intensive (<3 hours/day) rehab in post-acute setting upon discharge from acute hospital stay.  Recommendations communicated with TOC and documentation updated to reflect.     Recommendations for follow up therapy are one component of a multi-disciplinary discharge planning process, led by the attending physician.  Recommendations may be updated based on patient status, additional functional criteria and insurance authorization.  Follow Up Recommendations       Assistance Recommended at Discharge Frequent or constant Supervision/Assistance  Patient can return home with the following A little help with walking and/or transfers;A little help with  bathing/dressing/bathroom;Assistance with cooking/housework;Assist for transportation;Help with stairs or ramp for entrance   Equipment Recommendations       Recommendations for Other Services       Precautions / Restrictions Precautions Precautions: Fall Precaution Comments: Pt with consistent impulsivity Restrictions Weight Bearing Restrictions: Yes LLE Weight Bearing: Weight bearing as tolerated     Mobility  Bed Mobility Overal bed mobility: Needs Assistance Bed Mobility: Sit to Supine       Sit to supine: Supervision        Transfers Overall transfer level: Needs assistance Equipment used: Rolling walker (2 wheels) Transfers: Sit to/from Stand Sit to Stand: Min guard, Min assist           General transfer comment: Needing cues to use RW, as pt standing without RW unless cued to do so.    Ambulation/Gait Ambulation/Gait assistance: Min guard, Min assist Gait Distance (Feet): 150 Feet Assistive device: Rolling walker (2 wheels)         General Gait Details: mixed step to and step through gait pattern; limited heel strike/toe off bilat. Maintains forward flexed posture with poor L TKE in loading (increased risk for buckling).  Generally poor safety awareness/insight   Stairs Stairs: Yes Stairs assistance: Min assist Stair Management: Two rails Number of Stairs: 5 General stair comments: step to gait pattern; min cuing for L TKE in modified SLS, min cuing for technique. Improved foot placement/clearance noted with trial today   Wheelchair Mobility    Modified Rankin (Stroke Patients Only)       Balance Overall balance assessment: Needs assistance Sitting-balance support: No upper extremity supported, Feet supported Sitting balance-Leahy Scale: Good     Standing balance support: Bilateral  upper extremity supported Standing balance-Leahy Scale: Fair                              Cognition Arousal/Alertness: Awake/alert Behavior  During Therapy: Impulsive Overall Cognitive Status: Impaired/Different from baseline                                 General Comments: Oriented to basic information, but generally confused to more complex information.  Requires step-by-step verbal cuing for all functional tasks; little to no carry-over of cuing for safety.  Globally confused to anything beyond basic information.        Exercises Total Joint Exercises Goniometric ROM: 8-85 degrees Other Exercises Other Exercises: Sit/stand from recliner x3 with RW, min assist; standing balance for urinal, cga/close sup (does require UE support at all times to maintain balance)    General Comments        Pertinent Vitals/Pain Pain Assessment Pain Assessment: Faces Faces Pain Scale: Hurts a little bit Pain Intervention(s): Limited activity within patient's tolerance, Monitored during session, Repositioned    Home Living                          Prior Function            PT Goals (current goals can now be found in the care plan section) Acute Rehab PT Goals Patient Stated Goal: To be able to walk and work in the yard PT Goal Formulation: With patient Time For Goal Achievement: 12/28/22 Potential to Achieve Goals: Good Progress towards PT goals: Progressing toward goals    Frequency    BID      PT Plan Discharge plan needs to be updated    Co-evaluation              AM-PAC PT "6 Clicks" Mobility   Outcome Measure  Help needed turning from your back to your side while in a flat bed without using bedrails?: None Help needed moving from lying on your back to sitting on the side of a flat bed without using bedrails?: None Help needed moving to and from a bed to a chair (including a wheelchair)?: A Little Help needed standing up from a chair using your arms (e.g., wheelchair or bedside chair)?: A Little Help needed to walk in hospital room?: A Little Help needed climbing 3-5 steps with a  railing? : A Little 6 Click Score: 20    End of Session Equipment Utilized During Treatment: Gait belt Activity Tolerance: Patient tolerated treatment well Patient left: in chair;with call bell/phone within reach;with nursing/sitter in room;with family/visitor present Nurse Communication: Mobility status PT Visit Diagnosis: Unsteadiness on feet (R26.81);History of falling (Z91.81);Other abnormalities of gait and mobility (R26.89);Muscle weakness (generalized) (M62.81)     Time: 7622-6333 PT Time Calculation (min) (ACUTE ONLY): 30 min  Charges:  $Gait Training: 8-22 mins $Therapeutic Activity: 8-22 mins                     Skai Lickteig H. Manson Passey, PT, DPT, NCS 12/18/22, 12:51 PM [phone]

## 2022-12-18 NOTE — TOC Progression Note (Signed)
Transition of Care [facility]) - Progression Note    Patient Details  Name: Gregory [name] MRN: [id number] Date of Birth: [birth year]  [facility]) CM/SW Contact  Garret Reddish, RN Phone Number: 12/18/2022, 3:18 PM  Clinical Narrative:   Obtained PASSR.  PASSR number [id number].  Fl2 completed.  Bed search completed and sent to [facility].    TOC will continue to follow for discharge planning.      Expected Discharge Plan and Services         Expected Discharge Date: 12/17/22                                     Social Determinants of Health (SDOH) Interventions SDOH Screenings   Food Insecurity: No Food Insecurity (12/15/2022)  Housing: Low Risk  (12/15/2022)  Transportation Needs: No Transportation Needs (12/15/2022)  Utilities: Not At Risk (12/15/2022)  Tobacco Use: Low Risk  (12/16/2022)    Readmission Risk Interventions     No data to display

## 2022-12-19 ENCOUNTER — Inpatient Hospital Stay: Payer: Medicare Other

## 2022-12-19 LAB — CBC
HCT: 43.2 % (ref 39.0–52.0)
Hemoglobin: 14.7 g/dL (ref 13.0–17.0)
MCH: 29.7 pg (ref 26.0–34.0)
MCHC: 34 g/dL (ref 30.0–36.0)
MCV: 87.3 fL (ref 80.0–100.0)
Platelets: 233 10*3/uL (ref 150–400)
RBC: 4.95 MIL/uL (ref 4.22–5.81)
RDW: 13.6 % (ref 11.5–15.5)
WBC: 13.8 10*3/uL — ABNORMAL HIGH (ref 4.0–10.5)
nRBC: 0 % (ref 0.0–0.2)

## 2022-12-19 NOTE — TOC Progression Note (Signed)
Transition of Care [facility]) - Progression Note    Patient Details  Name: Gregory [name] MRN: [id number] Date of Birth: May 31, 1932  [facility]) CM/SW Contact  Carmina Miller, LCSWA Phone Number: 12/19/2022, 4:15 PM  Clinical Narrative:     CSW attempted to reach pt's brother Thelma Barge to discuss bed offers, no answer, no vm.        Expected Discharge Plan and Services         Expected Discharge Date: 12/17/22                                     Social Determinants of Health (SDOH) Interventions SDOH Screenings   Food Insecurity: No Food Insecurity (12/15/2022)  Housing: Low Risk  (12/15/2022)  Transportation Needs: No Transportation Needs (12/15/2022)  Utilities: Not At Risk (12/15/2022)  Tobacco Use: Low Risk  (12/16/2022)    Readmission Risk Interventions     No data to display

## 2022-12-19 NOTE — Plan of Care (Signed)

## 2022-12-19 NOTE — Progress Notes (Signed)
PT Cancellation Note  Patient Details Name: Gregory [name] MRN: [id number] DOB: Sep 27, 1931   Cancelled Treatment:     Pt remains up in recliner with LE's elevated. Pt continues to struggle attaining last 8 degrees of L knee extension. Polar Care reapplied and pt educated of importance. Still awaiting results from Left lower leg ultrasound. Will reassess tomorrow for appropriate mobility.   Jannet Askew 12/19/2022, 4:19 PM

## 2022-12-19 NOTE — Progress Notes (Signed)
PT refused to wear polar care this morning. Pt took off hospital gown and wore his outside clothes.

## 2022-12-19 NOTE — Progress Notes (Signed)
Subjective: 4 Days Post-Op Procedure(s) (LRB): TOTAL KNEE ARTHROPLASTY - RNFA (Left) Patient reports pain as mild.  Confusion is much improved.  Able to have conversation this morning. Patient is well, and has had no acute complaints or problems Plan is to go Home versus rehab after hospital stay.  Patient has good family support.  The patient is doing much better with his confusion.  No agitation. Negative for chest pain and shortness of breath Fever: no Gastrointestinal: Negative for nausea and vomiting.  Had a bowel movement yesterday.  Objective: Vital signs in last 24 hours: Temp:  [97.6 F (36.4 C)-99 F (37.2 C)] 99 F (37.2 C) (04/06 0548) Pulse Rate:  [86-98] 96 (04/06 0548) Resp:  [18] 18 (04/06 0548) BP: (151-168)/(83-98) 158/96 (04/06 0548) SpO2:  [96 %-97 %] 97 % (04/06 0548)  Intake/Output from previous day:  Intake/Output Summary (Last 24 hours) at 12/19/2022 0624 Last data filed at 12/18/2022 1913 Gross per 24 hour  Intake 120 ml  Output 450 ml  Net -330 ml    Intake/Output this shift: Total I/O In: 120 [P.O.:120] Out: -   Labs: Recent Labs    12/16/22 0636 12/19/22 0454  HGB 12.3* 14.7   Recent Labs    12/16/22 0636 12/19/22 0454  WBC 12.1* 13.8*  RBC 4.09* 4.95  HCT 36.1* 43.2  PLT 181 233   Recent Labs    12/16/22 0636 12/18/22 0452  NA 137 138  K 3.2* 3.8  CL 108 106  CO2 22 22  BUN 22 24*  CREATININE 0.82 0.68  GLUCOSE 169* 115*  CALCIUM 8.7* 9.1   No results for input(s): "LABPT", "INR" in the last 72 hours.   EXAM General - Patient is Alert and Confused Extremity - Neurovascular intact Sensation intact distally Dorsiflexion/Plantar flexion intact Compartment soft Full extension to 120 degrees of flexion. Dressing/Incision - clean, dry, no drainage Motor Function - intact, moving foot and toes well on exam.  Ambulated 150 feet with physical therapy.  Able to straight leg raise independently.  Past Medical History:   Diagnosis Date   Arthritis    CAD (coronary artery disease) 08/05/2005   a.) LHC 08/05/2005: 20% pLM, 60% pLAD, 80% mLAD, 100% OM1, 40% pRCA, 100% mRCA -- CVTS consult; b.) 4v CABG 08/07/2005   Carotid stenosis    DDD (degenerative disc disease), lumbar    Diastolic dysfunction    a.) TTE 08/30/2012: EF 45%, lat HK, triv MR, G1DD; b.) TTE 12/10/2016: EF 40%, mild LAE, mild panvalvular regurg; c.) TTE 10/05/2019: EF 50%, mild BAE, mild RVE, triv AR/PR, mild MR/TR, G1DD   Dilated cardiomyopathy    a.) TTE 08/30/2012: EF 45%; b.) TTE 12/10/2016: EF 40%; c.) MPI 08/22/2019: EF 57%; d.) TTE 10/05/2019: EF 50%   DOE (dyspnea on exertion)    Headache    History of bilateral cataract extraction    Hyperlipidemia    Hypertension    Myocardial infarction 07/2005   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   Weight as of this encounter: 79.2 kg. Discharge to rehab for physical therapy.  DVT Prophylaxis - Foot Pumps, TED hose, and Eliquis Weight-Bearing as tolerated to left leg  Dedra Skeens, PA-C Orthopaedic Surgery 12/19/2022, 6:24 AM

## 2022-12-19 NOTE — Progress Notes (Signed)
Physical Therapy Treatment Patient Details Name: Gregory [name] MRN: [id number] DOB: 27-Apr-1932 Today's Date: 12/19/2022   History of Present Illness 89 yo M with degenerative joint disease of the left knee and is s/p elective L TKA 4/2.  PMH includes HTN, CAD, MI, lumbar DDD, and R RTC repair.  Pt also reported a history of R partial knee replacement.    PT Comments    Pt dressed in his personal clothes stating he was going to church this morning. Education provided to orient pt as well as memory sheet for pt to help reduce current confusion level. Pt required CAG to raise from recliner chair, ModA to raise from low toilet seat. Gait training in room only with RW since pt was attempting to mobilize self upon therapist arrival. Pt positioned in recliner with LE's elevated and polar care applied after use of bathroom. Pt continues to express significant tenderness and edema in left lower leg/calf. Secure chat regarding concerns, awaiting ultrasound to r/o possible clot/obstruction. Will await results prior to mobilizing pt in pm.   Recommendations for follow up therapy are one component of a multi-disciplinary discharge planning process, led by the attending physician.  Recommendations may be updated based on patient status, additional functional criteria and insurance authorization.  Follow Up Recommendations  Can patient physically be transported by private vehicle: Yes    Assistance Recommended at Discharge Frequent or constant Supervision/Assistance  Patient can return home with the following A little help with walking and/or transfers;A little help with bathing/dressing/bathroom;Assistance with cooking/housework;Assist for transportation;Help with stairs or ramp for entrance   Equipment Recommendations  Rolling walker (2 wheels);BSC/3in1    Recommendations for Other Services       Precautions / Restrictions Precautions Precautions: Fall Precaution Comments: Pt with consistent  impulsivity Restrictions Weight Bearing Restrictions: Yes LLE Weight Bearing: Weight bearing as tolerated     Mobility  Bed Mobility               General bed mobility comments: Not witnessed today; pt seated in recliner chair when OT arrived.    Transfers Overall transfer level: Needs assistance Equipment used: Rolling walker (2 wheels) Transfers: Sit to/from Stand Sit to Stand: Min guard (ModA from low toilet)           General transfer comment: Needing strong cues for proper  RW use    Ambulation/Gait Ambulation/Gait assistance: Min guard, Min assist Gait Distance (Feet):  (25) Assistive device: Rolling walker (2 wheels) Gait Pattern/deviations: Step-to pattern, Decreased step length - right, Decreased stance time - left, Trunk flexed Gait velocity: decreased     General Gait Details: mixed step to and step through gait pattern; limited heel strike/toe off bilat. Maintains forward flexed posture with poor L TKE in loading (increased risk for buckling).  Generally poor safety awareness/insight   Stairs             Wheelchair Mobility    Modified Rankin (Stroke Patients Only)       Balance Overall balance assessment: Needs assistance Sitting-balance support: No upper extremity supported, Feet supported Sitting balance-Leahy Scale: Good     Standing balance support: Bilateral upper extremity supported, During functional activity, Reliant on assistive device for balance Standing balance-Leahy Scale: Fair Standing balance comment: poor safety awareness with RW                            Cognition Arousal/Alertness: Awake/alert Behavior During Therapy: Impulsive Overall Cognitive  Status: No family/caregiver present to determine baseline cognitive functioning Area of Impairment: Attention, Memory, Orientation, Following commands, Safety/judgement                 Orientation Level: Person, Situation     Following Commands:  Follows one step commands consistently Safety/Judgement: Decreased awareness of safety, Decreased awareness of deficits     General Comments:  (Per family friend, both pt and his brother have had declining cognitive status and live in a disheveled home)        Exercises      General Comments General comments (skin integrity, edema, etc.): Pt given "memory sheet" to remind him of current situation and decrease confusion level      Pertinent Vitals/Pain Pain Assessment Pain Assessment: Faces Faces Pain Scale: Hurts a little bit Pain Location:  (Left knee, left calf) Pain Descriptors / Indicators: Aching, Grimacing, Discomfort, Guarding Pain Intervention(s): Limited activity within patient's tolerance, Other (comment) (Sent secure chat message to Nursing and MD regarding R lower leg tenderness)    Home Living                          Prior Function            PT Goals (current goals can now be found in the care plan section) Acute Rehab PT Goals Patient Stated Goal: To be able to walk and work in the yard    Frequency    BID      PT Plan Discharge plan needs to be updated    Co-evaluation              AM-PAC PT "6 Clicks" Mobility   Outcome Measure  Help needed turning from your back to your side while in a flat bed without using bedrails?: None Help needed moving from lying on your back to sitting on the side of a flat bed without using bedrails?: None Help needed moving to and from a bed to a chair (including a wheelchair)?: A Little Help needed standing up from a chair using your arms (e.g., wheelchair or bedside chair)?: A Little Help needed to walk in hospital room?: A Little Help needed climbing 3-5 steps with a railing? : A Little 6 Click Score: 20    End of Session Equipment Utilized During Treatment: Gait belt Activity Tolerance: Patient tolerated treatment well Patient left: in chair;with call bell/phone within reach;with chair alarm  set Nurse Communication: Mobility status PT Visit Diagnosis: Unsteadiness on feet (R26.81);History of falling (Z91.81);Other abnormalities of gait and mobility (R26.89);Muscle weakness (generalized) (M62.81)     Time: 1037-1100 PT Time Calculation (min) (ACUTE ONLY): 23 min  Charges:  $Gait Training: 8-22 mins $Therapeutic Activity: 8-22 mins                    Zadie CleverlyKaren Gissell Barra, PTA  Jannet AskewKaren C Syncere Kaminski 12/19/2022, 2:41 PM

## 2022-12-20 NOTE — Progress Notes (Signed)
  Subjective: 5 Days Post-Op Procedure(s) (LRB): TOTAL KNEE ARTHROPLASTY - RNFA (Left) Patient reports pain as mild.  Confusion is much improved.  Able to have conversation this morning. Patient is well, and has had no acute complaints or problems Plan is to go Home versus rehab after hospital stay.  Patient has good family support.  The patient is doing much better with his confusion.  No agitation. Negative for chest pain and shortness of breath Fever: no Gastrointestinal: Negative for nausea and vomiting.  Had a bowel movement yesterday.  Objective: Vital signs in last 24 hours: Temp:  [97.9 F (36.6 C)-98.2 F (36.8 C)] 98.2 F (36.8 C) (04/07 0824) Pulse Rate:  [89-92] 91 (04/07 0824) Resp:  [18] 18 (04/07 0824) BP: (115-143)/(68-86) 115/68 (04/07 0824) SpO2:  [95 %-97 %] 95 % (04/07 0824)  Intake/Output from previous day: No intake or output data in the 24 hours ending 12/20/22 1205   Intake/Output this shift: No intake/output data recorded.  Labs: Recent Labs    12/19/22 0454  HGB 14.7   Recent Labs    12/19/22 0454  WBC 13.8*  RBC 4.95  HCT 43.2  PLT 233   Recent Labs    12/18/22 0452  NA 138  K 3.8  CL 106  CO2 22  BUN 24*  CREATININE 0.68  GLUCOSE 115*  CALCIUM 9.1   No results for input(s): "LABPT", "INR" in the last 72 hours.   EXAM General - Patient is Alert and Confused Extremity - Neurovascular intact Sensation intact distally Dorsiflexion/Plantar flexion intact Compartment soft Full extension to 120 degrees of flexion. Dressing/Incision - clean, dry, no drainage Motor Function - intact, moving foot and toes well on exam.  Ambulated 150 feet with physical therapy.  Able to straight leg raise independently.  Past Medical History:  Diagnosis Date   Arthritis    CAD (coronary artery disease) 08/05/2005   a.) LHC 08/05/2005: 20% pLM, 60% pLAD, 80% mLAD, 100% OM1, 40% pRCA, 100% mRCA -- CVTS consult; b.) 4v CABG 08/07/2005   Carotid  stenosis    DDD (degenerative disc disease), lumbar    Diastolic dysfunction    a.) TTE 08/30/2012: EF 45%, lat HK, triv MR, G1DD; b.) TTE 12/10/2016: EF 40%, mild LAE, mild panvalvular regurg; c.) TTE 10/05/2019: EF 50%, mild BAE, mild RVE, triv AR/PR, mild MR/TR, G1DD   Dilated cardiomyopathy    a.) TTE 08/30/2012: EF 45%; b.) TTE 12/10/2016: EF 40%; c.) MPI 08/22/2019: EF 57%; d.) TTE 10/05/2019: EF 50%   DOE (dyspnea on exertion)    Headache    History of bilateral cataract extraction    Hyperlipidemia    Hypertension    Myocardial infarction 07/2005   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   Weight as of this encounter: 79.2 kg. Discharge to rehab for physical therapy.  DVT Prophylaxis - Foot Pumps, TED hose, and Eliquis Weight-Bearing as tolerated to left leg  Dedra Skeens, PA-C Orthopaedic Surgery 12/20/2022, 12:05 PM

## 2022-12-20 NOTE — TOC Progression Note (Addendum)
Transition of Care [facility]) - Progression Note    Patient Details  Name: Gregory [name] MRN: [id number] Date of Birth: 11-May-1932  [facility]) CM/SW Contact  Bridgett Larsson, [place] Phone Number: 12/20/2022, 10:32 AM  Clinical Narrative:    4/7 10:30 AM CSW attempted to reach pt's brother Thelma Barge to discuss bed offers, no answer, no vm. Will make additional attempts throughout the day.  5:05 PM CSW spoke with pt's brother, Thelma Barge. He is agreeable to patient returning home and states he is able to provide supervision. Thelma Barge would like to speak with pt regarding HH or DME needs. TOC will continue to monitor for additional needs prior to d/c      Expected Discharge Plan and Services         Expected Discharge Date: 12/17/22                                     Social Determinants of Health (SDOH) Interventions SDOH Screenings   Food Insecurity: No Food Insecurity (12/15/2022)  Housing: Low Risk  (12/15/2022)  Transportation Needs: No Transportation Needs (12/15/2022)  Utilities: Not At Risk (12/15/2022)  Tobacco Use: Low Risk  (12/16/2022)    Readmission Risk Interventions     No data to display

## 2022-12-20 NOTE — Progress Notes (Signed)
Physical Therapy Treatment Patient Details Name: Gregory [name] MRN: [id number] DOB: 06-13-1932 Today's Date: 12/20/2022   History of Present Illness 89 yo M with degenerative joint disease of the left knee and is s/p elective L TKA 4/2.  PMH includes HTN, CAD, MI, lumbar DDD, and R RTC repair.  Pt also reported a history of R partial knee replacement.    PT Comments    Pt ready for session in chair.  Participated in exercises as described below.  He is able to stand and progress gait 100' x 2 with RW and min guard/assist.  He does use bathroom to void.  Cues for hand placements and general safety with navigation.   Recommendations for follow up therapy are one component of a multi-disciplinary discharge planning process, led by the attending physician.  Recommendations may be updated based on patient status, additional functional criteria and insurance authorization.  Follow Up Recommendations       Assistance Recommended at Discharge Frequent or constant Supervision/Assistance  Patient can return home with the following A little help with walking and/or transfers;A little help with bathing/dressing/bathroom;Assistance with cooking/housework;Assist for transportation;Help with stairs or ramp for entrance   Equipment Recommendations  Rolling walker (2 wheels);BSC/3in1    Recommendations for Other Services       Precautions / Restrictions Precautions Precautions: Fall Precaution Comments: Pt with consistent impulsivity Restrictions Weight Bearing Restrictions: Yes LLE Weight Bearing: Weight bearing as tolerated     Mobility  Bed Mobility               General bed mobility comments: in recliner before and after    Transfers Overall transfer level: Needs assistance Equipment used: Rolling walker (2 wheels) Transfers: Sit to/from Stand Sit to Stand: Min guard (ModA from low toilet)           General transfer comment: Needing strong cues for proper  RW use     Ambulation/Gait Ambulation/Gait assistance: Min guard, Min assist Gait Distance (Feet): 100 Feet Assistive device: Rolling walker (2 wheels) Gait Pattern/deviations: Step-to pattern, Decreased step length - right, Decreased stance time - left, Trunk flexed Gait velocity: decreased     General Gait Details: 100' x 2 with seated rest on toilet to void   Stairs             Wheelchair Mobility    Modified Rankin (Stroke Patients Only)       Balance Overall balance assessment: Needs assistance Sitting-balance support: No upper extremity supported, Feet supported Sitting balance-Leahy Scale: Good     Standing balance support: Bilateral upper extremity supported, During functional activity, Reliant on assistive device for balance Standing balance-Leahy Scale: Fair                              Cognition   Behavior During Therapy: WFL for tasks assessed/performed Overall Cognitive Status: No family/caregiver present to determine baseline cognitive functioning                                          Exercises Total Joint Exercises Ankle Circles/Pumps: AROM, 15 reps Quad Sets: Strengthening, 10 reps Short Arc Quad: Strengthening, 10 reps Heel Slides: AROM, 10 reps (with resisted leg ext) Hip ABduction/ADduction: Strengthening, 10 reps Straight Leg Raises: AROM, 10 reps Knee Flexion: PROM, 5 reps Goniometric ROM: 0-92  General Comments        Pertinent Vitals/Pain Pain Assessment Pain Assessment: Faces Faces Pain Scale: Hurts a little bit Pain Descriptors / Indicators: Aching, Grimacing, Discomfort, Guarding Pain Intervention(s): Limited activity within patient's tolerance, Monitored during session, Ice applied    Home Living                          Prior Function            PT Goals (current goals can now be found in the care plan section) Progress towards PT goals: Progressing toward goals     Frequency    BID      PT Plan Discharge plan needs to be updated    Co-evaluation              AM-PAC PT "6 Clicks" Mobility   Outcome Measure  Help needed turning from your back to your side while in a flat bed without using bedrails?: None Help needed moving from lying on your back to sitting on the side of a flat bed without using bedrails?: None Help needed moving to and from a bed to a chair (including a wheelchair)?: A Little Help needed standing up from a chair using your arms (e.g., wheelchair or bedside chair)?: A Little Help needed to walk in hospital room?: A Little Help needed climbing 3-5 steps with a railing? : A Little 6 Click Score: 20    End of Session Equipment Utilized During Treatment: Gait belt Activity Tolerance: Patient tolerated treatment well Patient left: in chair;with call bell/phone within reach;with chair alarm set Nurse Communication: Mobility status PT Visit Diagnosis: Unsteadiness on feet (R26.81);History of falling (Z91.81);Other abnormalities of gait and mobility (R26.89);Muscle weakness (generalized) (M62.81)     Time: 6812-7517 PT Time Calculation (min) (ACUTE ONLY): 18 min  Charges:  $Gait Training: 8-22 mins                   Danielle Dess, PTA 12/20/22, 12:46 PM

## 2022-12-20 NOTE — Plan of Care (Signed)
Educated pt avoiding bend at knee and proper mobility with front wheel walker.   Problem: Education: Goal: Knowledge of General Education information will improve Description: Including pain rating scale, medication(s)/side effects and non-pharmacologic comfort measures Outcome: Progressing   Problem: Health Behavior/Discharge Planning: Goal: Ability to manage health-related needs will improve Outcome: Progressing   Problem: Clinical Measurements: Goal: Ability to maintain clinical measurements within normal limits will improve Outcome: Progressing Goal: Will remain free from infection Outcome: Progressing Goal: Diagnostic test results will improve Outcome: Progressing Goal: Respiratory complications will improve Outcome: Progressing Goal: Cardiovascular complication will be avoided Outcome: Progressing   Problem: Activity: Goal: Risk for activity intolerance will decrease Outcome: Progressing   Problem: Nutrition: Goal: Adequate nutrition will be maintained Outcome: Progressing   Problem: Coping: Goal: Level of anxiety will decrease Outcome: Progressing   Problem: Elimination: Goal: Will not experience complications related to bowel motility Outcome: Progressing Goal: Will not experience complications related to urinary retention Outcome: Progressing   Problem: Pain Managment: Goal: General experience of comfort will improve Outcome: Progressing   Problem: Safety: Goal: Ability to remain free from injury will improve Outcome: Progressing   Problem: Skin Integrity: Goal: Risk for impaired skin integrity will decrease Outcome: Progressing

## 2022-12-20 NOTE — Plan of Care (Signed)
  Problem: Safety: Goal: Ability to remain free from injury will improve Outcome: Progressing   Problem: Skin Integrity: Goal: Risk for impaired skin integrity will decrease Outcome: Progressing   Problem: Pain Managment: Goal: General experience of comfort will improve Outcome: Progressing   Problem: Elimination: Goal: Will not experience complications related to bowel motility Outcome: Progressing   Problem: Nutrition: Goal: Adequate nutrition will be maintained Outcome: Progressing   Problem: Coping: Goal: Level of anxiety will decrease Outcome: Progressing   

## 2022-12-21 NOTE — Progress Notes (Signed)
Physical Therapy Treatment Patient Details Name: Gregory [name] MRN: [id number] DOB: 07/07/1932 Today's Date: 12/21/2022   History of Present Illness 89 yo M with degenerative joint disease of the left knee and is s/p elective L TKA 4/2.  PMH includes HTN, CAD, MI, lumbar DDD, and R RTC repair.  Pt also reported a history of R partial knee replacement.    PT Comments    Participated in exercises as described below.  Pt is able to walk x 1 lap on unit today.  Cognition improved.  Pt will benefit from continued PT services to transition to a safe discharge home.  Recommendations for follow up therapy are one component of a multi-disciplinary discharge planning process, led by the attending physician.  Recommendations may be updated based on patient status, additional functional criteria and insurance authorization.  Follow Up Recommendations       Assistance Recommended at Discharge Frequent or constant Supervision/Assistance  Patient can return home with the following A little help with walking and/or transfers;A little help with bathing/dressing/bathroom;Assistance with cooking/housework;Assist for transportation;Help with stairs or ramp for entrance   Equipment Recommendations  Rolling walker (2 wheels);BSC/3in1    Recommendations for Other Services       Precautions / Restrictions Precautions Precautions: Fall Restrictions Weight Bearing Restrictions: Yes LLE Weight Bearing: Weight bearing as tolerated     Mobility  Bed Mobility               General bed mobility comments: in recliner before and after    Transfers Overall transfer level: Needs assistance Equipment used: Rolling walker (2 wheels) Transfers: Sit to/from Stand Sit to Stand: Min guard                Ambulation/Gait Ambulation/Gait assistance: Min guard, Min assist Gait Distance (Feet): 160 Feet Assistive device: Rolling walker (2 wheels) Gait Pattern/deviations: Step-to pattern, Decreased  step length - right, Decreased stance time - left, Trunk flexed Gait velocity: decreased     General Gait Details: more exagerated limp noted today   Stairs             Wheelchair Mobility    Modified Rankin (Stroke Patients Only)       Balance Overall balance assessment: Needs assistance Sitting-balance support: No upper extremity supported, Feet supported Sitting balance-Leahy Scale: Good     Standing balance support: Bilateral upper extremity supported, During functional activity, Reliant on assistive device for balance Standing balance-Leahy Scale: Fair                              Cognition Arousal/Alertness: Awake/alert Behavior During Therapy: WFL for tasks assessed/performed Overall Cognitive Status: Impaired/Different from baseline                                 General Comments: imporved today.  some repetative conversations but overall better and orientated to time, place and situation        Exercises Total Joint Exercises Ankle Circles/Pumps: AROM, 15 reps Quad Sets: Strengthening, 10 reps Short Arc Quad: Strengthening, 10 reps Heel Slides: AROM, 10 reps (with resisted leg ext) Hip ABduction/ADduction: Strengthening, 10 reps Straight Leg Raises: AROM, 10 reps Knee Flexion: PROM, 5 reps Goniometric ROM: 0-95    General Comments        Pertinent Vitals/Pain Pain Assessment Pain Assessment: Faces Faces Pain Scale: Hurts a little bit Pain Descriptors /  Indicators: Aching, Grimacing, Discomfort, Guarding Pain Intervention(s): Limited activity within patient's tolerance, Monitored during session, Repositioned, Ice applied    Home Living                          Prior Function            PT Goals (current goals can now be found in the care plan section) Progress towards PT goals: Progressing toward goals    Frequency    BID      PT Plan Discharge plan needs to be updated    Co-evaluation               AM-PAC PT "6 Clicks" Mobility   Outcome Measure  Help needed turning from your back to your side while in a flat bed without using bedrails?: None Help needed moving from lying on your back to sitting on the side of a flat bed without using bedrails?: None Help needed moving to and from a bed to a chair (including a wheelchair)?: A Little Help needed standing up from a chair using your arms (e.g., wheelchair or bedside chair)?: A Little Help needed to walk in hospital room?: A Little Help needed climbing 3-5 steps with a railing? : A Little 6 Click Score: 20    End of Session Equipment Utilized During Treatment: Gait belt Activity Tolerance: Patient tolerated treatment well Patient left: in chair;with call bell/phone within reach;with chair alarm set Nurse Communication: Mobility status PT Visit Diagnosis: Unsteadiness on feet (R26.81);History of falling (Z91.81);Other abnormalities of gait and mobility (R26.89);Muscle weakness (generalized) (M62.81)     Time: 2297-9892 PT Time Calculation (min) (ACUTE ONLY): 11 min  Charges:  $Gait Training: 8-22 mins                   Danielle Dess, PTA 12/21/22, 10:09 AM

## 2022-12-21 NOTE — Progress Notes (Signed)
  Subjective: 6 Days Post-Op Procedure(s) (LRB): TOTAL KNEE ARTHROPLASTY - RNFA (Left) Patient reports pain as mild.  Confusion is much improved.  Able to have conversation this morning. Patient is well, and has had no acute complaints or problems Plan is to go Home versus rehab after hospital stay.  Patient has good family support.  The patient is doing much better with his confusion.  No agitation. Negative for chest pain and shortness of breath Fever: no Gastrointestinal: Negative for nausea and vomiting.  Had a bowel movement yesterday.  Objective: Vital signs in last 24 hours: Temp:  [97.8 F (36.6 C)-98.5 F (36.9 C)] 97.8 F (36.6 C) (04/07 2316) Pulse Rate:  [91-100] 92 (04/07 2316) Resp:  [16-20] 20 (04/07 2316) BP: (111-147)/(61-89) 147/79 (04/07 2316) SpO2:  [95 %-98 %] 97 % (04/07 2316)  Intake/Output from previous day: No intake or output data in the 24 hours ending 12/21/22 0713   Intake/Output this shift: No intake/output data recorded.  Labs: Recent Labs    12/19/22 0454  HGB 14.7   Recent Labs    12/19/22 0454  WBC 13.8*  RBC 4.95  HCT 43.2  PLT 233   No results for input(s): "NA", "K", "CL", "CO2", "BUN", "CREATININE", "GLUCOSE", "CALCIUM" in the last 72 hours.  No results for input(s): "LABPT", "INR" in the last 72 hours.   EXAM General - Patient is Alert and Confused Extremity - Neurovascular intact Sensation intact distally Dorsiflexion/Plantar flexion intact Compartment soft Full extension to 120 degrees of flexion. Dressing/Incision - clean, dry, no drainage Motor Function - intact, moving foot and toes well on exam.  Ambulated 150 feet with physical therapy.  Able to straight leg raise independently.  Past Medical History:  Diagnosis Date   Arthritis    CAD (coronary artery disease) 08/05/2005   a.) LHC 08/05/2005: 20% pLM, 60% pLAD, 80% mLAD, 100% OM1, 40% pRCA, 100% mRCA -- CVTS consult; b.) 4v CABG 08/07/2005   Carotid stenosis     DDD (degenerative disc disease), lumbar    Diastolic dysfunction    a.) TTE 08/30/2012: EF 45%, lat HK, triv MR, G1DD; b.) TTE 12/10/2016: EF 40%, mild LAE, mild panvalvular regurg; c.) TTE 10/05/2019: EF 50%, mild BAE, mild RVE, triv AR/PR, mild MR/TR, G1DD   Dilated cardiomyopathy    a.) TTE 08/30/2012: EF 45%; b.) TTE 12/10/2016: EF 40%; c.) MPI 08/22/2019: EF 57%; d.) TTE 10/05/2019: EF 50%   DOE (dyspnea on exertion)    Headache    History of bilateral cataract extraction    Hyperlipidemia    Hypertension    Myocardial infarction 07/2005   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   Weight as of this encounter: 79.2 kg. Discharge to rehab for physical therapy.  DVT Prophylaxis - Foot Pumps, TED hose, and Eliquis Weight-Bearing as tolerated to left leg  Dedra Skeens, PA-C Orthopaedic Surgery 12/21/2022, 7:13 AM

## 2022-12-21 NOTE — Care Management Important Message (Signed)
Important Message  Patient Details  Name: Gregory [name] MRN: [id number] Date of Birth: 06-Feb-1932   Medicare Important Message Given:  Yes     Olegario Messier A Megha Agnes 12/21/2022, 2:58 PM

## 2022-12-21 NOTE — Plan of Care (Signed)

## 2022-12-21 NOTE — Progress Notes (Signed)
Physical Therapy Treatment Patient Details Name: Gregory [name] MRN: [id number] DOB: Dec 29, 1931 Today's Date: 12/21/2022   History of Present Illness 89 yo M with degenerative joint disease of the left knee and is s/p elective L TKA 4/2.  PMH includes HTN, CAD, MI, lumbar DDD, and R RTC repair.  Pt also reported a history of R partial knee replacement.    PT Comments    Pt ready to walk again this am.  Completed lap and ex as below.    Recommendations for follow up therapy are one component of a multi-disciplinary discharge planning process, led by the attending physician.  Recommendations may be updated based on patient status, additional functional criteria and insurance authorization.  Follow Up Recommendations       Assistance Recommended at Discharge Frequent or constant Supervision/Assistance  Patient can return home with the following A little help with walking and/or transfers;A little help with bathing/dressing/bathroom;Assistance with cooking/housework;Assist for transportation;Help with stairs or ramp for entrance   Equipment Recommendations  Rolling walker (2 wheels);BSC/3in1    Recommendations for Other Services       Precautions / Restrictions Precautions Precautions: Fall Restrictions Weight Bearing Restrictions: Yes LLE Weight Bearing: Weight bearing as tolerated     Mobility  Bed Mobility               General bed mobility comments: in recliner before and after Patient Response: Cooperative  Transfers Overall transfer level: Needs assistance Equipment used: Rolling walker (2 wheels) Transfers: Sit to/from Stand Sit to Stand: Min guard                Ambulation/Gait Ambulation/Gait assistance: Min guard, Min assist Gait Distance (Feet): 160 Feet Assistive device: Rolling walker (2 wheels) Gait Pattern/deviations: Step-to pattern, Decreased step length - right, Decreased stance time - left, Trunk flexed Gait velocity: decreased      General Gait Details: more exagerated limp noted today   Stairs             Wheelchair Mobility    Modified Rankin (Stroke Patients Only)       Balance Overall balance assessment: Needs assistance Sitting-balance support: No upper extremity supported, Feet supported Sitting balance-Leahy Scale: Good     Standing balance support: Bilateral upper extremity supported, During functional activity, Reliant on assistive device for balance Standing balance-Leahy Scale: Fair                              Cognition Arousal/Alertness: Awake/alert Behavior During Therapy: WFL for tasks assessed/performed Overall Cognitive Status: Impaired/Different from baseline                                 General Comments: imporved today.  some repetative conversations but overall better and orientated to time, place and situation        Exercises Total Joint Exercises Ankle Circles/Pumps: AROM, 15 reps Quad Sets: Strengthening, 10 reps Short Arc Quad: Strengthening, 10 reps Heel Slides: AROM, 10 reps (with resisted leg ext) Hip ABduction/ADduction: Strengthening, 10 reps Straight Leg Raises: AROM, 10 reps Knee Flexion: PROM, 5 reps Goniometric ROM: 0-95    General Comments        Pertinent Vitals/Pain Pain Assessment Pain Assessment: Faces Faces Pain Scale: Hurts little more Pain Location: knee but declined need for pain meds Pain Descriptors / Indicators: Aching, Grimacing, Discomfort, Guarding Pain Intervention(s): Monitored during session,  Repositioned, Ice applied    Home Living                          Prior Function            PT Goals (current goals can now be found in the care plan section) Progress towards PT goals: Progressing toward goals    Frequency    BID      PT Plan Discharge plan needs to be updated    Co-evaluation              AM-PAC PT "6 Clicks" Mobility   Outcome Measure  Help needed  turning from your back to your side while in a flat bed without using bedrails?: None Help needed moving from lying on your back to sitting on the side of a flat bed without using bedrails?: None Help needed moving to and from a bed to a chair (including a wheelchair)?: A Little Help needed standing up from a chair using your arms (e.g., wheelchair or bedside chair)?: A Little Help needed to walk in hospital room?: A Little Help needed climbing 3-5 steps with a railing? : A Little 6 Click Score: 20    End of Session Equipment Utilized During Treatment: Gait belt Activity Tolerance: Patient tolerated treatment well Patient left: in chair;with call bell/phone within reach;with chair alarm set Nurse Communication: Mobility status PT Visit Diagnosis: Unsteadiness on feet (R26.81);History of falling (Z91.81);Other abnormalities of gait and mobility (R26.89);Muscle weakness (generalized) (M62.81)     Time: 1050-1102 PT Time Calculation (min) (ACUTE ONLY): 12 min  Charges:  $Gait Training: 8-22 mins                   Danielle Dess, PTA 12/21/22, 11:16 AM

## 2022-12-21 NOTE — TOC Progression Note (Addendum)
Transition of Care [facility]) - Progression Note    Patient Details  Name: Gregory [name] MRN: [id number] Date of Birth: 22-Oct-1931  [facility] CM/SW Contact  Marlowe Sax, RN Phone Number: 12/21/2022, 12:48 PM  Clinical Narrative:    Met with the patient and called his Brother that he lives aith while I was in the room, We discussed the bed offers and they chose [facility] [place], I notified Tonya at [facility] of them accepting, Ins pending  Ref number [id number] Expected Discharge Plan: Skilled Nursing Facility Barriers to Discharge: Insurance Authorization  Expected Discharge Plan and Services         Expected Discharge Date: 12/17/22                                     Social Determinants of Health (SDOH) Interventions SDOH Screenings   Food Insecurity: No Food Insecurity (12/15/2022)  Housing: Low Risk  (12/15/2022)  Transportation Needs: No Transportation Needs (12/15/2022)  Utilities: Not At Risk (12/15/2022)  Tobacco Use: Low Risk  (12/16/2022)    Readmission Risk Interventions     No data to display

## 2022-12-21 NOTE — Progress Notes (Signed)
Occupational Therapy Treatment Patient Details Name: Gregory [name] MRN: [id number] DOB: [birth year] Today's Date: 12/21/2022   History of present illness 89 yo M with degenerative joint disease of the left knee and is s/p elective L TKA 4/2.  PMH includes HTN, CAD, MI, lumbar DDD, and R RTC repair.  Pt also reported a history of R partial knee replacement.   OT comments  Patient received sitting in recliner and agreeable to OT. A&Ox3. He required VC for sequencing, redirection, and safety awareness t/o session. Pt completed functional mobility to the bathroom with Min guard using a RW. Pt with continent void on toilet and then engaged in sinkside grooming tasks. Improvements in impulsivity this date as compared to last OT session. Pt left as received with all needs in reach. Pt is making progress toward goal completion. D/C recommendation remains appropriate. OT will continue to follow acutely.    Recommendations for follow up therapy are one component of a multi-disciplinary discharge planning process, led by the attending physician.  Recommendations may be updated based on patient status, additional functional criteria and insurance authorization.    Assistance Recommended at Discharge Frequent or constant Supervision/Assistance  Patient can return home with the following  A little help with walking and/or transfers;A little help with bathing/dressing/bathroom;Assistance with cooking/housework;Direct supervision/assist for medications management;Direct supervision/assist for financial management;Assist for transportation;Help with stairs or ramp for entrance   Equipment Recommendations  Other (comment) (defer to next venue of care)    Recommendations for Other Services      Precautions / Restrictions Precautions Precautions: Fall Precaution Comments: impulsive Restrictions Weight Bearing Restrictions: Yes LLE Weight Bearing: Weight bearing as tolerated       Mobility Bed Mobility                General bed mobility comments: received/left in recliner    Transfers Overall transfer level: Needs assistance Equipment used: Rolling walker (2 wheels) Transfers: Sit to/from Stand Sit to Stand: Min guard           General transfer comment: STS from recliner     Balance Overall balance assessment: Needs assistance Sitting-balance support: No upper extremity supported, Feet supported Sitting balance-Leahy Scale: Good     Standing balance support: Bilateral upper extremity supported, During functional activity, Single extremity supported Standing balance-Leahy Scale: Fair           ADL either performed or assessed with clinical judgement   ADL Overall ADL's : Needs assistance/impaired     Grooming: Min guard;Standing;Wash/dry face;Oral care;Cueing for sequencing Grooming Details (indicate cue type and reason): Min VC for sequencing/redirection                 Toilet Transfer: Solicitor;Ambulation;Rolling walker (2 wheels);Grab bars;Cueing for safety   Toileting- Clothing Manipulation and Hygiene: Min guard;Sit to/from stand       Functional mobility during ADLs: Min guard;Rolling walker (2 wheels);Cueing for safety (~15 ft to the bathroom)      Extremity/Trunk Assessment              Vision       Perception     Praxis      Cognition Arousal/Alertness: Awake/alert Behavior During Therapy: WFL for tasks assessed/performed Overall Cognitive Status: Impaired/Different from baseline           General Comments: Oriented to self, location, month/year. Min VC for redirection and overal safety awareness.        Exercises      Shoulder Instructions  General Comments      Pertinent Vitals/ Pain       Pain Assessment Pain Assessment: No/denies pain  Home Living          Prior Functioning/Environment              Frequency  Min 2X/week        Progress Toward Goals  OT Goals(current  goals can now be found in the care plan section)  Progress towards OT goals: Progressing toward goals  Acute Rehab OT Goals Patient Stated Goal: go home OT Goal Formulation: With patient Time For Goal Achievement: 12/30/22 Potential to Achieve Goals: Good  Plan Discharge plan remains appropriate;Frequency remains appropriate    Co-evaluation                 AM-PAC OT "6 Clicks" Daily Activity     Outcome Measure   Help from another person eating meals?: None Help from another person taking care of personal grooming?: A Little Help from another person toileting, which includes using toliet, bedpan, or urinal?: A Little Help from another person bathing (including washing, rinsing, drying)?: A Little Help from another person to put on and taking off regular upper body clothing?: None Help from another person to put on and taking off regular lower body clothing?: A Little 6 Click Score: 20    End of Session Equipment Utilized During Treatment: Gait belt;Rolling walker (2 wheels)  OT Visit Diagnosis: Unsteadiness on feet (R26.81)   Activity Tolerance Patient tolerated treatment well   Patient Left in chair;with call bell/phone within reach;with chair alarm set   Nurse Communication Mobility status        Time: 6803-2122 OT Time Calculation (min): 18 min  Charges: OT General Charges $OT Visit: 1 Visit OT Treatments $Self Care/Home Management : 8-22 mins  [facility] MS, OTR/L ascom [phone]  12/21/22, 5:55 PM

## 2022-12-22 NOTE — Progress Notes (Signed)
  Subjective: 7 Days Post-Op Procedure(s) (LRB): TOTAL KNEE ARTHROPLASTY - RNFA (Left) Patient reports pain as mild.  Awaiting insurance approval for rehab center.  Family is still discussing possible returning home. Patient is well, and has had no acute complaints or problems Plan is to go Home versus rehab after hospital stay.  Patient will be discharged today to rehab versus home.  Patient has good family support.  The patient is doing much better with his confusion.  No agitation. Negative for chest pain and shortness of breath Fever: no Gastrointestinal: Negative for nausea and vomiting.  Had a bowel movement yesterday.  Objective: Vital signs in last 24 hours: Temp:  [97.9 F (36.6 C)-98.6 F (37 C)] 98.6 F (37 C) (04/08 2315) Pulse Rate:  [89-105] 89 (04/09 0048) Resp:  [18] 18 (04/09 0048) BP: (118-167)/(70-86) 153/74 (04/09 0048) SpO2:  [98 %-100 %] 98 % (04/08 2315)  Intake/Output from previous day:  Intake/Output Summary (Last 24 hours) at 12/22/2022 0638 Last data filed at 12/21/2022 1100 Gross per 24 hour  Intake 0 ml  Output --  Net 0 ml     Intake/Output this shift: No intake/output data recorded.  Labs: No results for input(s): "HGB" in the last 72 hours.  No results for input(s): "WBC", "RBC", "HCT", "PLT" in the last 72 hours.  No results for input(s): "NA", "K", "CL", "CO2", "BUN", "CREATININE", "GLUCOSE", "CALCIUM" in the last 72 hours.  No results for input(s): "LABPT", "INR" in the last 72 hours.   EXAM General - Patient is Alert and Confused Extremity - Neurovascular intact Sensation intact distally Dorsiflexion/Plantar flexion intact Compartment soft Full extension to 120 degrees of flexion. Dressing/Incision - clean, dry, no drainage Motor Function - intact, moving foot and toes well on exam.  Ambulated 150 feet with physical therapy.  Able to straight leg raise independently.  Past Medical History:  Diagnosis Date   Arthritis    CAD  (coronary artery disease) 08/05/2005   a.) LHC 08/05/2005: 20% pLM, 60% pLAD, 80% mLAD, 100% OM1, 40% pRCA, 100% mRCA -- CVTS consult; b.) 4v CABG 08/07/2005   Carotid stenosis    DDD (degenerative disc disease), lumbar    Diastolic dysfunction    a.) TTE 08/30/2012: EF 45%, lat HK, triv MR, G1DD; b.) TTE 12/10/2016: EF 40%, mild LAE, mild panvalvular regurg; c.) TTE 10/05/2019: EF 50%, mild BAE, mild RVE, triv AR/PR, mild MR/TR, G1DD   Dilated cardiomyopathy    a.) TTE 08/30/2012: EF 45%; b.) TTE 12/10/2016: EF 40%; c.) MPI 08/22/2019: EF 57%; d.) TTE 10/05/2019: EF 50%   DOE (dyspnea on exertion)    Headache    History of bilateral cataract extraction    Hyperlipidemia    Hypertension    Myocardial infarction 07/2005   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   Weight as of this encounter: 79.2 kg. Discharge to Home versus rehab for physical therapy.  DVT Prophylaxis - Foot Pumps, TED hose, and Eliquis Weight-Bearing as tolerated to left leg  Dedra Skeens, PA-C Orthopaedic Surgery 12/22/2022, 6:38 AM

## 2022-12-22 NOTE — TOC Progression Note (Signed)
Transition of Care [facility]) - Progression Note    Patient Details  Name: Gregory [name] MRN: [id number] Date of Birth: [birth year]  Transition of Care [facility]) CM/SW Contact  Marlowe Sax, RN Phone Number: 12/22/2022, 8:53 AM  Clinical Narrative:    [employer], still pending Auth ID [id number]   Expected Discharge Plan: Skilled Nursing Facility Barriers to Discharge: Insurance Authorization  Expected Discharge Plan and Services         Expected Discharge Date: 12/22/22                                     Social Determinants of Health (SDOH) Interventions SDOH Screenings   Food Insecurity: No Food Insecurity (12/15/2022)  Housing: Low Risk  (12/15/2022)  Transportation Needs: No Transportation Needs (12/15/2022)  Utilities: Not At Risk (12/15/2022)  Tobacco Use: Low Risk  (12/16/2022)    Readmission Risk Interventions     No data to display

## 2022-12-22 NOTE — Progress Notes (Addendum)
Physical Therapy Treatment Patient Details Name: Gregory [name] MRN: [id number] DOB: 07/05/1932 Today's Date: 12/22/2022   History of Present Illness 89 yo M with degenerative joint disease of the left knee and is s/p elective L TKA 4/2.  PMH includes HTN, CAD, MI, lumbar DDD, and R RTC repair.  Pt also reported a history of R partial knee replacement.    PT Comments    Pt ready for session.  Continues with improved cognition but does seem surprised when he looks at the clock that it is not even 11:00 yet "I thought it was much later than that!  You mean I haven't had lunch yet?"  He is able to complete lap, toiletting and exercises with min guard/assist.  Trouble at times following cues for ex and needs hands on or demo for activity.   Recommendations for follow up therapy are one component of a multi-disciplinary discharge planning process, led by the attending physician.  Recommendations may be updated based on patient status, additional functional criteria and insurance authorization.  Follow Up Recommendations       Assistance Recommended at Discharge Frequent or constant Supervision/Assistance  Patient can return home with the following A little help with walking and/or transfers;A little help with bathing/dressing/bathroom;Assistance with cooking/housework;Assist for transportation;Help with stairs or ramp for entrance   Equipment Recommendations  Rolling walker (2 wheels);BSC/3in1    Recommendations for Other Services       Precautions / Restrictions Precautions Precautions: Fall Precaution Comments: impulsive Restrictions Weight Bearing Restrictions: Yes RLE Weight Bearing: Weight bearing as tolerated LLE Weight Bearing: Weight bearing as tolerated     Mobility  Bed Mobility               General bed mobility comments: received/left in recliner    Transfers Overall transfer level: Needs assistance Equipment used: Rolling walker (2 wheels) Transfers: Sit  to/from Stand Sit to Stand: Min guard                Ambulation/Gait Gait - min assist for safety balance and navigation  Gait Distance (Feet): 160 Feet Assistive device: Rolling walker (2 wheels) Gait Pattern/deviations: Step-to pattern, Decreased step length - right, Decreased stance time - left, Trunk flexed Gait velocity: decreased     General Gait Details: continues to need +1 for supervision/safety   Stairs             Wheelchair Mobility    Modified Rankin (Stroke Patients Only)       Balance Overall balance assessment: Needs assistance Sitting-balance support: No upper extremity supported, Feet supported Sitting balance-Leahy Scale: Good     Standing balance support: Bilateral upper extremity supported, During functional activity, Single extremity supported Standing balance-Leahy Scale: Fair                              Cognition Arousal/Alertness: Awake/alert Behavior During Therapy: WFL for tasks assessed/performed Overall Cognitive Status: Impaired/Different from baseline                                 General Comments: continues to clear daily but confusion remains.        Exercises Total Joint Exercises Ankle Circles/Pumps: AROM, 15 reps Quad Sets: Strengthening, 10 reps Short Arc Quad: Strengthening, 10 reps Heel Slides: AROM, 10 reps (with resisted leg ext) Hip ABduction/ADduction: Strengthening, 10 reps Straight Leg Raises: AROM, 10  reps Knee Flexion: PROM, 5 reps Goniometric ROM: 0-90 - some increased discomfort with stretching today    General Comments        Pertinent Vitals/Pain Pain Assessment Pain Assessment: Faces Faces Pain Scale: Hurts a little bit Pain Descriptors / Indicators: Aching, Grimacing, Discomfort, Guarding Pain Intervention(s): Monitored during session, Repositioned, Ice applied    Home Living                          Prior Function            PT Goals  (current goals can now be found in the care plan section) Progress towards PT goals: Progressing toward goals    Frequency    BID      PT Plan Current plan remains appropriate    Co-evaluation              AM-PAC PT "6 Clicks" Mobility   Outcome Measure  Help needed turning from your back to your side while in a flat bed without using bedrails?: None Help needed moving from lying on your back to sitting on the side of a flat bed without using bedrails?: None Help needed moving to and from a bed to a chair (including a wheelchair)?: A Little Help needed standing up from a chair using your arms (e.g., wheelchair or bedside chair)?: A Little Help needed to walk in hospital room?: A Little Help needed climbing 3-5 steps with a railing? : A Little 6 Click Score: 20    End of Session Equipment Utilized During Treatment: Gait belt Activity Tolerance: Patient tolerated treatment well Patient left: in chair;with call bell/phone within reach;with chair alarm set Nurse Communication: Mobility status PT Visit Diagnosis: Unsteadiness on feet (R26.81);History of falling (Z91.81);Other abnormalities of gait and mobility (R26.89);Muscle weakness (generalized) (M62.81)     Time: 9381-8299 PT Time Calculation (min) (ACUTE ONLY): 20 min  Charges:  $Gait Training: 8-22 mins                   Danielle Dess, PTA 12/22/22, 11:40 AM

## 2022-12-22 NOTE — Plan of Care (Signed)
  Problem: Education: Goal: Knowledge of General Education information will improve Description: Including pain rating scale, medication(s)/side effects and non-pharmacologic comfort measures 12/22/2022 0634 by Domanik Rainville M, RN Outcome: Progressing 12/22/2022 0622 by Bryleigh Ottaway M, RN Outcome: Progressing   Problem: Health Behavior/Discharge Planning: Goal: Ability to manage health-related needs will improve 12/22/2022 0634 by Takota Cahalan M, RN Outcome: Progressing 12/22/2022 0622 by Talley Kreiser M, RN Outcome: Progressing   Problem: Clinical Measurements: Goal: Ability to maintain clinical measurements within normal limits will improve 12/22/2022 0634 by Kaeli Nichelson M, RN Outcome: Progressing 12/22/2022 0622 by Artesia Berkey M, RN Outcome: Progressing Goal: Will remain free from infection 12/22/2022 0634 by Javarri Segal M, RN Outcome: Progressing 12/22/2022 0622 by Parminder Trapani M, RN Outcome: Progressing Goal: Diagnostic test results will improve 12/22/2022 0634 by Kirsten Spearing M, RN Outcome: Progressing 12/22/2022 0622 by Shaili Donalson M, RN Outcome: Progressing Goal: Respiratory complications will improve 12/22/2022 0634 by Rilyn Upshaw M, RN Outcome: Progressing 12/22/2022 0622 by Gailene Youkhana M, RN Outcome: Progressing Goal: Cardiovascular complication will be avoided 12/22/2022 0634 by Eual Lindstrom M, RN Outcome: Progressing 12/22/2022 0622 by Ozie Lupe M, RN Outcome: Progressing   Problem: Activity: Goal: Risk for activity intolerance will decrease 12/22/2022 0634 by Kimiya Brunelle M, RN Outcome: Progressing 12/22/2022 0622 by Magalie Almon M, RN Outcome: Progressing   Problem: Nutrition: Goal: Adequate nutrition will be maintained 12/22/2022 0634 by Mercede Rollo M, RN Outcome: Progressing 12/22/2022 0622 by Kiela Shisler M, RN Outcome: Progressing   Problem: Coping: Goal: Level of anxiety will decrease 12/22/2022 0634 by Printice Hellmer M, RN Outcome: Progressing 12/22/2022 0622 by Dellamae Rosamilia M,  RN Outcome: Progressing   Problem: Elimination: Goal: Will not experience complications related to bowel motility 12/22/2022 0634 by Danis Pembleton M, RN Outcome: Progressing 12/22/2022 0622 by Genell Thede M, RN Outcome: Progressing Goal: Will not experience complications related to urinary retention 12/22/2022 0634 by Jenniferann Stuckert M, RN Outcome: Progressing 12/22/2022 0622 by Joyia Riehle M, RN Outcome: Progressing   Problem: Pain Managment: Goal: General experience of comfort will improve 12/22/2022 0634 by Daryl Beehler M, RN Outcome: Progressing 12/22/2022 0622 by Said Rueb M, RN Outcome: Progressing   Problem: Safety: Goal: Ability to remain free from injury will improve 12/22/2022 0634 by Maymuna Detzel M, RN Outcome: Progressing 12/22/2022 0622 by Ireanna Finlayson M, RN Outcome: Progressing   Problem: Skin Integrity: Goal: Risk for impaired skin integrity will decrease 12/22/2022 0634 by Anallely Rosell M, RN Outcome: Progressing 12/22/2022 0622 by Sabrie Moritz M, RN Outcome: Progressing   

## 2022-12-22 NOTE — Progress Notes (Signed)
Physical Therapy Treatment Patient Details Name: Gregory [name] MRN: [id number] DOB: 11/01/1931 Today's Date: 12/22/2022   History of Present Illness 89 yo M with degenerative joint disease of the left knee and is s/p elective L TKA 4/2.  PMH includes HTN, CAD, MI, lumbar DDD, and R RTC repair.  Pt also reported a history of R partial knee replacement.    PT Comments    Pt in room with twin brother and family friend.  He is able to stand and walk x 1 lap on unit and use bathroom with min a x 1.  Pt continues with limp and occasionally stopping due to discomfort with gait to "work it out".  He continues to need assist and cues for sit to stand transitions especially on lower surfaces.    Brother and son in and discussed discharge.  They do have 2 steps to access home without rails.  Encouraged them to work on getting rails installed for discharge and general safety.  Friend confirmed little to no outside support to assist in discharge.  They had no children or other family living nearby.  With cognition not back at baseline, continued gait deficits and little outside support continued PT interventions would be appropriate to assist him in returning to baseline and a safe transition home.   Recommendations for follow up therapy are one component of a multi-disciplinary discharge planning process, led by the attending physician.  Recommendations may be updated based on patient status, additional functional criteria and insurance authorization.  Follow Up Recommendations       Assistance Recommended at Discharge Frequent or constant Supervision/Assistance  Patient can return home with the following A little help with walking and/or transfers;A little help with bathing/dressing/bathroom;Assistance with cooking/housework;Assist for transportation;Help with stairs or ramp for entrance   Equipment Recommendations  Rolling walker (2 wheels);BSC/3in1    Recommendations for Other Services        Precautions / Restrictions Precautions Precautions: Fall Precaution Comments: impulsive Restrictions Weight Bearing Restrictions: Yes LLE Weight Bearing: Weight bearing as tolerated     Mobility  Bed Mobility               General bed mobility comments: received/left in recliner    Transfers Overall transfer level: Needs assistance Equipment used: Rolling walker (2 wheels) Transfers: Sit to/from Stand Sit to Stand: Min assist                Ambulation/Gait Ambulation/Gait assistance: Min Chemical engineer (Feet): 160 Feet Assistive device: Rolling walker (2 wheels) Gait Pattern/deviations: Step-to pattern, Decreased step length - right, Decreased stance time - left, Trunk flexed Gait velocity: decreased     General Gait Details: continues to need +1 for supervision/safety   Stairs             Wheelchair Mobility    Modified Rankin (Stroke Patients Only)       Balance Overall balance assessment: Needs assistance Sitting-balance support: No upper extremity supported, Feet supported Sitting balance-Leahy Scale: Good     Standing balance support: Bilateral upper extremity supported, During functional activity, Single extremity supported Standing balance-Leahy Scale: Fair                              Cognition Arousal/Alertness: Awake/alert Behavior During Therapy: WFL for tasks assessed/performed Overall Cognitive Status: Impaired/Different from baseline  General Comments: continues to clear daily but confusion remains.        Exercises Total Joint Exercises Ankle Circles/Pumps: AROM, 15 reps Quad Sets: Strengthening, 10 reps Short Arc Quad: Strengthening, 10 reps Heel Slides: AROM, 10 reps (with resisted leg ext) Hip ABduction/ADduction: Strengthening, 10 reps Straight Leg Raises: AROM, 10 reps Knee Flexion: PROM, 5 reps Goniometric ROM: 0-90 - some increased discomfort  with stretching today Other Exercises Other Exercises: to bathroom to void    General Comments        Pertinent Vitals/Pain Pain Assessment Pain Assessment: Faces Faces Pain Scale: Hurts a little bit Pain Location: knee but declined need for pain meds Pain Descriptors / Indicators: Aching, Grimacing, Discomfort, Guarding Pain Intervention(s): Limited activity within patient's tolerance, Monitored during session, Repositioned, Ice applied    Home Living                          Prior Function            PT Goals (current goals can now be found in the care plan section) Progress towards PT goals: Progressing toward goals    Frequency    BID      PT Plan Current plan remains appropriate    Co-evaluation              AM-PAC PT "6 Clicks" Mobility   Outcome Measure  Help needed turning from your back to your side while in a flat bed without using bedrails?: None Help needed moving from lying on your back to sitting on the side of a flat bed without using bedrails?: None Help needed moving to and from a bed to a chair (including a wheelchair)?: A Little Help needed standing up from a chair using your arms (e.g., wheelchair or bedside chair)?: A Little Help needed to walk in hospital room?: A Little Help needed climbing 3-5 steps with a railing? : A Little 6 Click Score: 20    End of Session Equipment Utilized During Treatment: Gait belt Activity Tolerance: Patient tolerated treatment well Patient left: in chair;with call bell/phone within reach;with chair alarm set;with family/visitor present Nurse Communication: Mobility status PT Visit Diagnosis: Unsteadiness on feet (R26.81);History of falling (Z91.81);Other abnormalities of gait and mobility (R26.89);Muscle weakness (generalized) (M62.81)     Time: 5686-1683 PT Time Calculation (min) (ACUTE ONLY): 20 min  Charges:  $Gait Training: 8-22 mins                   Danielle Dess, PTA 12/22/22,  2:50 PM

## 2022-12-22 NOTE — TOC Progression Note (Signed)
Transition of Care [facility]) - Progression Note    Patient Details  Name: Gregory [name] MRN: [id number] Date of Birth: 10-13-1931  Transition of Care [facility]) CM/SW Contact  Marlowe Sax, RN Phone Number: 12/22/2022, 3:05 PM  Clinical Narrative:     Received a message from the [facility] PT reviewing the case, they requested new PT notes, Uploaded and sent the updated PT notes, Auth pending  Expected Discharge Plan: Skilled Nursing Facility Barriers to Discharge: Insurance Authorization  Expected Discharge Plan and Services         Expected Discharge Date: 12/22/22                                     Social Determinants of Health (SDOH) Interventions SDOH Screenings   Food Insecurity: No Food Insecurity (12/15/2022)  Housing: Low Risk  (12/15/2022)  Transportation Needs: No Transportation Needs (12/15/2022)  Utilities: Not At Risk (12/15/2022)  Tobacco Use: Low Risk  (12/16/2022)    Readmission Risk Interventions     No data to display

## 2022-12-23 NOTE — TOC Progression Note (Signed)
Transition of Care [facility]) - Progression Note    Patient Details  Name: Gregory [name] MRN: [id number] Date of Birth: April 13, 1932  Transition of Care [facility]) CM/SW Contact  Marlowe Sax, RN Phone Number: 12/23/2022, 11:23 AM  Clinical Narrative:     The patient will be going home with [facility] for Home health Services, his brother is aware, RW and 3 in 1 in the room  Expected Discharge Plan: Skilled Nursing Facility Barriers to Discharge: Insurance Authorization  Expected Discharge Plan and Services         Expected Discharge Date: 12/23/22                                     Social Determinants of Health (SDOH) Interventions SDOH Screenings   Food Insecurity: No Food Insecurity (12/15/2022)  Housing: Low Risk  (12/15/2022)  Transportation Needs: No Transportation Needs (12/15/2022)  Utilities: Not At Risk (12/15/2022)  Tobacco Use: Low Risk  (12/16/2022)    Readmission Risk Interventions     No data to display

## 2022-12-23 NOTE — Progress Notes (Signed)
Physical Therapy Treatment Patient Details Name: Gregory [name] MRN: [id number] DOB: [birth year] Today's Date: 12/23/2022   History of Present Illness 89 yo M with degenerative joint disease of the left knee and is s/p elective L TKA 4/2.  PMH includes HTN, CAD, MI, lumbar DDD, and R RTC repair.  Pt also reported a history of R partial knee replacement.    PT Comments    Pt calm and cooperative t/o session but still showing poor general insight and safety awareness despite simple and repeated cuing, etc.  He was able to do exercises but needed repeated explanation and cuing, similarly he was able to ambulate with relative ease but needed constant reinforcement/cuing to insure consistent and safe cadence.  Reiterated resting knee extension posturing (pt and family), pt continues to lack any recall of learning these again and again over the last week.  Pt moving relatively well, but cognition/awareness continues to be his biggest issue.     Recommendations for follow up therapy are one component of a multi-disciplinary discharge planning process, led by the attending physician.  Recommendations may be updated based on patient status, additional functional criteria and insurance authorization.  Follow Up Recommendations  Can patient physically be transported by private vehicle: Yes    Assistance Recommended at Discharge Frequent or constant Supervision/Assistance  Patient can return home with the following A little help with walking and/or transfers;A little help with bathing/dressing/bathroom;Assistance with cooking/housework;Assist for transportation;Help with stairs or ramp for entrance   Equipment Recommendations  Rolling walker (2 wheels);BSC/3in1    Recommendations for Other Services       Precautions / Restrictions Precautions Precautions: Fall Restrictions Weight Bearing Restrictions: Yes LLE Weight Bearing: Weight bearing as tolerated     Mobility  Bed Mobility Overal bed  mobility: Needs Assistance Bed Mobility: Supine to Sit     Supine to sit: Supervision Sit to supine: Supervision        Transfers Overall transfer level: Needs assistance Equipment used: Rolling walker (2 wheels) Transfers: Sit to/from Stand Sit to Stand: Min assist           General transfer comment: Cuing for positioning and sequencing, pt able to rise from low seat height w/o direct assist but needing extra time, cuing and heavy UE reliance to achieve    Ambulation/Gait Ambulation/Gait assistance: Min assist Gait Distance (Feet): 200 Feet Assistive device: Rolling walker (2 wheels)         General Gait Details: Pt continues to show poor overall awareness, but with light directional/encouragement cuing he was able to do prolonged bout of ambulation with heavy UE/walker use but no LOBs or overt safety issues.   Stairs Stairs: Yes Stairs assistance: Min assist Stair Management:  (HHA with light use of contralateral hand on (not holding) rail) Number of Stairs: 6     Wheelchair Mobility    Modified Rankin (Stroke Patients Only)       Balance Overall balance assessment: Needs assistance Sitting-balance support: No upper extremity supported, Feet supported Sitting balance-Leahy Scale: Good     Standing balance support: Bilateral upper extremity supported, During functional activity, Single extremity supported Standing balance-Leahy Scale: Fair Standing balance comment: poor general safety awareness with no LOBs duing ambulation/RW use                            Cognition Arousal/Alertness: Awake/alert Behavior During Therapy: WFL for tasks assessed/performed Overall Cognitive Status: Impaired/Different from baseline Area of Impairment:  Attention, Memory, Orientation, Following commands, Safety/judgement                         Safety/Judgement: Decreased awareness of safety, Decreased awareness of deficits               Exercises Total Joint Exercises Ankle Circles/Pumps: AROM, 15 reps Quad Sets: Strengthening, 20 reps Short Arc Quad: Strengthening, 10 reps Heel Slides: AROM, 10 reps (with resisted leg ext) Hip ABduction/ADduction: Strengthening, 10 reps Straight Leg Raises: AROM, 10 reps Goniometric ROM: 4-100    General Comments General comments (skin integrity, edema, etc.): Pt was able to tolerate PT session relatively well, needing consistent safety and general awareness cues but post-op TKA metrics looking good      Pertinent Vitals/Pain Pain Assessment Pain Assessment: Faces Faces Pain Scale: Hurts a little bit Pain Location: L knee    Home Living                          Prior Function            PT Goals (current goals can now be found in the care plan section) Progress towards PT goals: Progressing toward goals    Frequency    BID      PT Plan Current plan remains appropriate    Co-evaluation              AM-PAC PT "6 Clicks" Mobility   Outcome Measure  Help needed turning from your back to your side while in a flat bed without using bedrails?: None Help needed moving from lying on your back to sitting on the side of a flat bed without using bedrails?: None Help needed moving to and from a bed to a chair (including a wheelchair)?: A Little Help needed standing up from a chair using your arms (e.g., wheelchair or bedside chair)?: A Little Help needed to walk in hospital room?: A Little Help needed climbing 3-5 steps with a railing? : A Little 6 Click Score: 20    End of Session Equipment Utilized During Treatment: Gait belt Activity Tolerance: Patient tolerated treatment well Patient left: with call bell/phone within reach;with chair alarm set;with family/visitor present Nurse Communication: Mobility status PT Visit Diagnosis: Unsteadiness on feet (R26.81);History of falling (Z91.81);Other abnormalities of gait and mobility (R26.89);Muscle weakness  (generalized) (M62.81)     Time: 1062-6948 PT Time Calculation (min) (ACUTE ONLY): 32 min  Charges:  $Gait Training: 8-22 mins $Therapeutic Exercise: 8-22 mins                     Malachi Pro, DPT 12/23/2022, 10:54 AM

## 2022-12-23 NOTE — Progress Notes (Signed)
   Subjective: 8 Days Post-Op Procedure(s) (LRB): TOTAL KNEE ARTHROPLASTY - RNFA (Left) Patient reports pain as mild.   Patient is well, and has had no acute complaints or problems Denies any CP, SOB, ABD pain. We will continue therapy today.  Plan is to go Home after hospital stay.  Objective: Vital signs in last 24 hours: Temp:  [98 F (36.7 C)] 98 F (36.7 C) (04/10 0044) Pulse Rate:  [86-96] 96 (04/10 0044) Resp:  [18] 18 (04/10 0044) BP: (131-157)/(68-97) 157/97 (04/10 0044) SpO2:  [97 %] 97 % (04/10 0044)  Intake/Output from previous day: 04/09 0701 - 04/10 0700 In: 240 [P.O.:240] Out: 100 [Urine:100] Intake/Output this shift: No intake/output data recorded.  No results for input(s): "HGB" in the last 72 hours. No results for input(s): "WBC", "RBC", "HCT", "PLT" in the last 72 hours. No results for input(s): "NA", "K", "CL", "CO2", "BUN", "CREATININE", "GLUCOSE", "CALCIUM" in the last 72 hours. No results for input(s): "LABPT", "INR" in the last 72 hours.  EXAM General - Patient is Alert, Appropriate, and Oriented Extremity - Neurovascular intact Sensation intact distally Intact pulses distally Dorsiflexion/Plantar flexion intact No cellulitis present Compartment soft Dressing - dressing C/D/I and no drainage Motor Function - intact, moving foot and toes well on exam.   Past Medical History:  Diagnosis Date   Arthritis    CAD (coronary artery disease) 08/05/2005   a.) LHC 08/05/2005: 20% pLM, 60% pLAD, 80% mLAD, 100% OM1, 40% pRCA, 100% mRCA -- CVTS consult; b.) 4v CABG 08/07/2005   Carotid stenosis    DDD (degenerative disc disease), lumbar    Diastolic dysfunction    a.) TTE 08/30/2012: EF 45%, lat HK, triv MR, G1DD; b.) TTE 12/10/2016: EF 40%, mild LAE, mild panvalvular regurg; c.) TTE 10/05/2019: EF 50%, mild BAE, mild RVE, triv AR/PR, mild MR/TR, G1DD   Dilated cardiomyopathy    a.) TTE 08/30/2012: EF 45%; b.) TTE 12/10/2016: EF 40%; c.) MPI 08/22/2019:  EF 57%; d.) TTE 10/05/2019: EF 50%   DOE (dyspnea on exertion)    Headache    History of bilateral cataract extraction    Hyperlipidemia    Hypertension    Myocardial infarction 07/2005   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   Weight as of this encounter: 79.2 kg. Advance diet Up with therapy Good progress with PT Patient requesting to go home. Dr. Joice Lofts spoke to family on phone. CM to assist with discharge to home with HHPT today.  DVT Prophylaxis - TED hose and SCDs Eliquis Weight-Bearing as tolerated to left leg   T. Cranston Neighbor, PA-C [facility] 12/23/2022, 8:22 AM

## 2023-01-05 ENCOUNTER — Encounter: Payer: Self-pay | Admitting: Surgery

## 2023-06-25 ENCOUNTER — Ambulatory Visit
Admission: EM | Admit: 2023-06-25 | Discharge: 2023-06-25 | Disposition: A | Discharge status: Home / Self Care | Payer: Medicare Other

## 2023-06-25 ENCOUNTER — Encounter: Payer: Self-pay | Admitting: Emergency Medicine

## 2023-06-25 ENCOUNTER — Encounter (INDEPENDENT_AMBULATORY_CARE_PROVIDER_SITE_OTHER): Payer: Medicare Other | Admitting: Vascular Surgery

## 2023-06-25 DIAGNOSIS — R42 Dizziness and giddiness: Secondary | ICD-10-CM

## 2023-06-25 NOTE — ED Provider Notes (Signed)
[facility]    CSN: [id number] Arrival date & time: 06/25/23  1351      History   Chief Complaint Chief Complaint  Patient presents with   Dizziness    HPI Gregory [name] is a 89 y.o. male presenting for possible cerumen impaction of both ears.  Patient states he has had intermittent dizziness which he mentioned to vein specialist when he spoke with him on the phone recently.  He says they told him he might need to have his ears cleaned out.  He reports having cerumen impaction in the past which caused him to feel a bit dizzy.  He says he has itching inside his ears at times.  He reports cleaning his ears out with Q-tips frequently.  Patient has been referred to [facility] vein specialist for swelling of left lower leg status post left knee replacement 6 months ago.  Patient says he has had persistent swelling in this leg.  It is no worse than normal.  He has not noticed any redness.  Sometimes the skin will weep.  He does not think this is related to potential dizziness.  He says he is not feeling dizzy at all at this time and feels quite well.  He says he does not know anything that could make him feel dizzy.  Denies any dizziness with position changes or head movements. No dizziness upon standing. No chest pain, palpitations, fatigue, vomiting.  Has not tried any OTC meds.  He reports the dizziness is very mild, intermittent has been going on for several months.  He says he is not that worried about it.  He was accompanying his brother here today to have his ears cleaned out and thought he might need the same.     HPI  Past Medical History:  Diagnosis Date   Arthritis    CAD (coronary artery disease) 08/05/2005   a.) LHC 08/05/2005: 20% pLM, 60% pLAD, 80% mLAD, 100% OM1, 40% pRCA, 100% mRCA -- CVTS consult; b.) 4v CABG 08/07/2005   Carotid stenosis    DDD (degenerative disc disease), lumbar    Diastolic dysfunction    a.) TTE 08/30/2012: EF 45%, lat HK, triv MR, G1DD;  b.) TTE 12/10/2016: EF 40%, mild LAE, mild panvalvular regurg; c.) TTE 10/05/2019: EF 50%, mild BAE, mild RVE, triv AR/PR, mild MR/TR, G1DD   Dilated cardiomyopathy (HCC)    a.) TTE 08/30/2012: EF 45%; b.) TTE 12/10/2016: EF 40%; c.) MPI 08/22/2019: EF 57%; d.) TTE 10/05/2019: EF 50%   DOE (dyspnea on exertion)    Headache    History of bilateral cataract extraction    Hyperlipidemia    Hypertension    Myocardial infarction (HCC) 07/2005   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   a.) LIMA-LAD, SVG-D1, SVG-OM1, SVG-PDA   Valvular heart disease     Patient Active Problem List   Diagnosis Date Noted   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   Status post total knee replacement using cement, 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    Past Surgical History:  Procedure Laterality Date   CARPOMETACARPAL (CMC) FUSION OF THUMB Left 10/23/2020   Procedure: SUSPENSION ARTHROPLASTY OF LEFT THUMB CMC JOINT;  Surgeon: Christena Flake, MD;  Location: [facility] ORS;  Service: Orthopedics;  Laterality: Left;   CATARACT EXTRACTION Bilateral    CORONARY ARTERY BYPASS GRAFT N/A 08/07/2005   Procedure: CORONARY ARTERY BYPASS GRAFT; Location: [facility]; Surgeon: Marshell Garfinkel, MD   ESOPHAGEAL DILATION     x2   KNEE ARTHROSCOPY  ROTATOR CUFF REPAIR Right    TONSILLECTOMY     TOTAL KNEE ARTHROPLASTY Left 12/15/2022   Procedure: TOTAL KNEE ARTHROPLASTY - RNFA;  Surgeon: Christena Flake, MD;  Location: [facility] ORS;  Service: Orthopedics;  Laterality: Left;       Home Medications    Prior to Admission medications   Medication Sig Start Date End Date Taking? Authorizing Provider  acetaminophen (TYLENOL) 500 MG tablet Take 500 mg by mouth every 6 (six) hours as needed for headache.    [provider]  apixaban (ELIQUIS) 2.5 MG TABS tablet Take 1 tablet (2.5 mg total) by mouth 2 (two) times daily for 14 days. 12/16/22 12/30/22  Dedra Skeens, PA-C  Choline Fenofibrate (FENOFIBRIC ACID) 135 MG CPDR Take 135 mg by mouth every morning.    [provider]  lisinopril (ZESTRIL) 20 MG tablet Take 20 mg by mouth every morning.    [provider]  metoprolol succinate (TOPROL-XL) 25 MG 24 hr tablet Take 12.5 mg by mouth every morning.    [provider]  Multiple Vitamin (MULTIVITAMIN WITH MINERALS) TABS tablet Take 1 tablet by mouth daily.    [provider]  oxyCODONE (OXY IR/ROXICODONE) 5 MG immediate release tablet Take 0.5-1 tablets (2.5-5 mg total) by mouth every 4 (four) hours as needed for moderate pain (pain score 4-6). 12/16/22   Dedra Skeens, PA-C  pravastatin (PRAVACHOL) 40 MG tablet Take 80 mg by mouth every evening.    [provider]  psyllium (METAMUCIL) 58.6 % powder Take 1 packet by mouth daily.    [provider]  traMADol (ULTRAM) 50 MG tablet Take 1 tablet (50 mg total) by mouth every 6 (six) hours as needed for moderate pain or severe pain (Or for breakthrough pain). 12/16/22   Dedra Skeens, PA-C    Family History History reviewed. No pertinent family history.  Social History Social History   Tobacco Use   Smoking status: Never   Smokeless tobacco: Never  Vaping Use   Vaping status: Never Used  Substance Use Topics   Alcohol use: No   Drug use: No     Allergies   Hydrocodone   Review of Systems Review of Systems  Constitutional:  Negative for fatigue.  HENT:  Negative for congestion, ear discharge, ear pain and hearing loss.   Eyes:  Negative for photophobia and visual disturbance.  Respiratory:  Negative for shortness of breath.   Cardiovascular:  Positive for leg swelling. Negative for chest pain and palpitations.  Gastrointestinal:  Negative for nausea and vomiting.  Musculoskeletal:  Negative for arthralgias.  Skin:  Negative for color change.  Neurological:  Positive for dizziness (not currently). Negative for weakness, light-headedness, numbness and headaches.     Physical Exam Triage Vital Signs ED Triage Vitals  Encounter Vitals Group      BP 06/25/23 1409 (!) 155/85     Systolic BP Percentile --      Diastolic BP Percentile --      Pulse Rate 06/25/23 1409 71     Resp 06/25/23 1409 15     Temp 06/25/23 1409 97.9 F (36.6 C)     Temp Source 06/25/23 1409 Oral     SpO2 06/25/23 1409 95 %     Weight 06/25/23 1408 174 lb 9.7 oz (79.2 kg)     Height 06/25/23 1408 5\' 9"  (1.753 m)     Head Circumference --      Peak Flow --  Pain Score 06/25/23 1408 0     Pain Loc --      Pain Education --      Exclude from Growth Chart --    No data found.  Updated Vital Signs BP (!) 155/85 (BP Location: Left Arm)   Pulse 71   Temp 97.9 F (36.6 C) (Oral)   Resp 15   Ht 5\' 9"  (1.753 m)   Wt 174 lb 9.7 oz (79.2 kg)   SpO2 95%   BMI 25.78 kg/m     Physical Exam Vitals and nursing note reviewed.  Constitutional:      General: He is not in acute distress.    Appearance: Normal appearance. He is well-developed. He is not ill-appearing.  HENT:     Head: Normocephalic and atraumatic.     Right Ear: Tympanic membrane, ear canal and external ear normal.     Left Ear: Tympanic membrane, ear canal and external ear normal.     Nose: Nose normal.     Mouth/Throat:     Mouth: Mucous membranes are moist.     Pharynx: Oropharynx is clear.  Eyes:     General: No scleral icterus.    Extraocular Movements: Extraocular movements intact.     Conjunctiva/sclera: Conjunctivae normal.     Pupils: Pupils are equal, round, and reactive to light.  Cardiovascular:     Rate and Rhythm: Normal rate and regular rhythm.  Pulmonary:     Effort: Pulmonary effort is normal. No respiratory distress.     Breath sounds: Normal breath sounds.  Musculoskeletal:     Cervical back: Neck supple.     Right lower leg: No edema.     Left lower leg: Edema (1+ pitting to mid tibia. No TTP or erythema) present.  Skin:    General: Skin is warm and dry.     Capillary Refill: Capillary refill takes less than 2 seconds.  Neurological:     General: No focal  deficit present.     Mental Status: He is alert and oriented to person, place, and time. Mental status is at baseline.     Cranial Nerves: No cranial nerve deficit.     Motor: No weakness.     Coordination: Coordination normal.     Gait: Gait normal.  Psychiatric:        Mood and Affect: Mood normal.        Behavior: Behavior normal.      UC Treatments / Results  Labs (all labs ordered are listed, but only abnormal results are displayed) Labs Reviewed - No data to display  EKG   Radiology No results found.  Procedures Procedures (including critical care time)  Medications Ordered in UC Medications - No data to display  Initial Impression / Assessment and Plan / UC Course  I have reviewed the triage vital signs and the nursing notes.  Pertinent labs & imaging results that were available during my care of the patient were reviewed by me and considered in my medical decision making (see chart for details).   89 year old male presents for intermittent dizziness for the past several months.  Not currently feeling dizzy.  He came to the urgent care with his brother to have his brother's ears cleaned out and thought he might need to have his cleaned out as well.  BP slightly elevated at 155/85.  Vitals otherwise normal.  He is overall well-appearing.  On exam ears are clear.  No cerumen impaction noted.  Normal HEENT  exam.  Chest clear to auscultation heart regular rate and rhythm.  He has swelling of the left lower extremity without erythema or tenderness.  Patient denies any other associated symptoms and says he is not really worried about the dizziness as it is very mild, not going on at this time.  He says he just wanted to have the ears cleaned out if they were full of wax but they are not so he feels that is fine.  He plans to follow-up with providers as scheduled.  Reviewed ED precautions.  Have advised him to continue all home medications.  He declines AVS.   Final Clinical  Impressions(s) / UC Diagnoses   Final diagnoses:  Dizziness   Discharge Instructions   None    ED Prescriptions   None    PDMP not reviewed this encounter.   Shirlee Latch, PA-C 06/25/23 1506

## 2023-06-25 NOTE — ED Triage Notes (Signed)
Patient had knee surgery in April 2024.  Patient sees Dr. Joice Lofts.  Patient was referred to [facility] and will see tthem this month. Patient states that he sometimes feels dizzy off and on for a month.  Patient states that it comes and goes.  Patient denies dizziness at this time.

## 2023-07-06 ENCOUNTER — Encounter (INDEPENDENT_AMBULATORY_CARE_PROVIDER_SITE_OTHER): Payer: Self-pay | Admitting: Vascular Surgery

## 2023-07-06 ENCOUNTER — Ambulatory Visit (INDEPENDENT_AMBULATORY_CARE_PROVIDER_SITE_OTHER): Payer: Medicare Other | Admitting: Vascular Surgery

## 2023-07-06 VITALS — BP 176/84 | HR 75 | Resp 18 | Ht 67.5 in | Wt 175.8 lb

## 2023-07-06 DIAGNOSIS — I420 Dilated cardiomyopathy: Secondary | ICD-10-CM | POA: Diagnosis not present

## 2023-07-06 DIAGNOSIS — E785 Hyperlipidemia, unspecified: Secondary | ICD-10-CM | POA: Insufficient documentation

## 2023-07-06 DIAGNOSIS — M7989 Other specified soft tissue disorders: Secondary | ICD-10-CM | POA: Diagnosis not present

## 2023-07-06 DIAGNOSIS — I10 Essential (primary) hypertension: Secondary | ICD-10-CM | POA: Diagnosis not present

## 2023-07-06 NOTE — Assessment & Plan Note (Signed)
Recommend:  I have had a long discussion with the patient regarding swelling and why it  causes symptoms.  Patient would placed an Unna boot in the left leg until the skin is healed and the swelling is much more under control.  He will continue wearing a compression sock on the right leg. Patient will begin wearing graduated compression on a daily basis. The patient will  wear the stockings first thing in the morning and removing them in the evening. The patient is instructed specifically not to sleep in the stockings.   In addition, behavioral modification will be initiated.  This will include frequent elevation, use of over the counter pain medications and exercise such as walking.  Consideration for a lymph pump will also be made based upon the effectiveness of conservative therapy.  This would help to improve the edema control and prevent sequela such as ulcers and infections   Patient should undergo duplex ultrasound of the venous system to ensure that DVT or reflux is not present.  The patient will follow-up with me after the ultrasound.

## 2023-07-06 NOTE — Assessment & Plan Note (Signed)
This would definitely worsen his lower extremity swelling.

## 2023-07-06 NOTE — Progress Notes (Signed)
Patient ID: Gregory [name], male   DOB: [birth year], 89 y.o.   MRN: [id number]  Chief Complaint  Patient presents with   New Patient (Initial Visit)    np. consult. (Pt states his legs are weeping) BLE edema. poggi, john.    HPI Gregory [name] is a 89 y.o. male.  I am asked to see the patient by Dr. Joice Lofts for evaluation of leg swelling.  The patient has had a partial knee replacement on the right some years ago and had very little trouble from this.  He has previously had a knee replacement on the left.  Over the past few months, his left legs become markedly swollen and had weeping.  The skin is very dry and scaly and he has recurring scabs.  He is developing more right leg swelling as well.  He has some dry scaling skin on the right but nowhere near as severe as the left.  It is not overtly painful but just heavy and uncomfortable due to the increased weight of the leg.  He has multiple medical issues as listed below.     Past Medical History:  Diagnosis Date   Arthritis    CAD (coronary artery disease) 08/05/2005   a.) LHC 08/05/2005: 20% pLM, 60% pLAD, 80% mLAD, 100% OM1, 40% pRCA, 100% mRCA -- CVTS consult; b.) 4v CABG 08/07/2005   Carotid stenosis    DDD (degenerative disc disease), lumbar    Diastolic dysfunction    a.) TTE 08/30/2012: EF 45%, lat HK, triv MR, G1DD; b.) TTE 12/10/2016: EF 40%, mild LAE, mild panvalvular regurg; c.) TTE 10/05/2019: EF 50%, mild BAE, mild RVE, triv AR/PR, mild MR/TR, G1DD   Dilated cardiomyopathy (HCC)    a.) TTE 08/30/2012: EF 45%; b.) TTE 12/10/2016: EF 40%; c.) MPI 08/22/2019: EF 57%; d.) TTE 10/05/2019: EF 50%   DOE (dyspnea on exertion)    Headache    History of bilateral cataract extraction    Hyperlipidemia    Hypertension    Myocardial infarction (HCC) 07/2005   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   a.) LIMA-LAD, SVG-D1, SVG-OM1, SVG-PDA   Valvular heart disease     Past Surgical History:  Procedure Laterality Date   CARPOMETACARPAL (CMC) FUSION  OF THUMB Left 10/23/2020   Procedure: SUSPENSION ARTHROPLASTY OF LEFT THUMB CMC JOINT;  Surgeon: Christena Flake, MD;  Location: [facility];  Service: Orthopedics;  Laterality: Left;   CATARACT EXTRACTION Bilateral    CORONARY ARTERY BYPASS GRAFT N/A 08/07/2005   Procedure: CORONARY ARTERY BYPASS GRAFT; Location: [facility]; Surgeon: Marshell Garfinkel, MD   ESOPHAGEAL DILATION     x2   KNEE ARTHROSCOPY     ROTATOR CUFF REPAIR Right    TONSILLECTOMY     TOTAL KNEE ARTHROPLASTY Left 12/15/2022   Procedure: TOTAL KNEE ARTHROPLASTY - RNFA;  Surgeon: Christena Flake, MD;  Location: [facility];  Service: Orthopedics;  Laterality: Left;     History reviewed. No pertinent family history.    Social History   Tobacco Use   Smoking status: Never   Smokeless tobacco: Never  Vaping Use   Vaping status: Never Used  Substance Use Topics   Alcohol use: No   Drug use: No     Allergies  Allergen Reactions   Hydrocodone Nausea Only    Current Outpatient Medications  Medication Sig Dispense Refill   acetaminophen (TYLENOL) 500 MG tablet Take 500 mg by mouth every 6 (six) hours as needed for headache.  apixaban (ELIQUIS) 2.5 MG TABS tablet Take 1 tablet (2.5 mg total) by mouth 2 (two) times daily for 14 days. 28 tablet 0   Choline Fenofibrate (FENOFIBRIC ACID) 135 MG CPDR Take 135 mg by mouth every morning.     lisinopril (ZESTRIL) 20 MG tablet Take 20 mg by mouth every morning.     metoprolol succinate (TOPROL-XL) 25 MG 24 hr tablet Take 12.5 mg by mouth every morning.     Multiple Vitamin (MULTIVITAMIN WITH MINERALS) TABS tablet Take 1 tablet by mouth daily.     oxyCODONE (OXY IR/ROXICODONE) 5 MG immediate release tablet Take 0.5-1 tablets (2.5-5 mg total) by mouth every 4 (four) hours as needed for moderate pain (pain score 4-6). 30 tablet 0   pravastatin (PRAVACHOL) 40 MG tablet Take 80 mg by mouth every evening.     psyllium (METAMUCIL) 58.6 % powder Take 1 packet by mouth daily.     traMADol (ULTRAM)  50 MG tablet Take 1 tablet (50 mg total) by mouth every 6 (six) hours as needed for moderate pain or severe pain (Or for breakthrough pain). 30 tablet 0   No current facility-administered medications for this visit.      REVIEW OF SYSTEMS (Negative unless checked)  Constitutional: [] Weight loss  [] Fever  [] Chills Cardiac: [] Chest pain   [] Chest pressure   [] Palpitations   [] Shortness of breath when laying flat   [] Shortness of breath at rest   [] Shortness of breath with exertion. Vascular:  [] Pain in legs with walking   [] Pain in legs at rest   [] Pain in legs when laying flat   [] Claudication   [] Pain in feet when walking  [] Pain in feet at rest  [] Pain in feet when laying flat   [] History of DVT   [] Phlebitis   [x] Swelling in legs   [] Varicose veins   [] Non-healing ulcers Pulmonary:   [] Uses home oxygen   [] Productive cough   [] Hemoptysis   [] Wheeze  [] COPD   [] Asthma Neurologic:  [] Dizziness  [] Blackouts   [] Seizures   [] History of stroke   [] History of TIA  [] Aphasia   [] Temporary blindness   [] Dysphagia   [] Weakness or numbness in arms   [] Weakness or numbness in legs Musculoskeletal:  [x] Arthritis   [] Joint swelling   [] Joint pain   [] Low back pain Hematologic:  [] Easy bruising  [] Easy bleeding   [] Hypercoagulable state   [] Anemic  [] Hepatitis Gastrointestinal:  [] Blood in stool   [] Vomiting blood  [] Gastroesophageal reflux/heartburn   [] Abdominal pain Genitourinary:  [] Chronic kidney disease   [] Difficult urination  [] Frequent urination  [] Burning with urination   [] Hematuria Skin:  [] Rashes   [x] Ulcers   [x] Wounds Psychological:  [] History of anxiety   []  History of major depression.    Physical Exam BP (!) 176/84 (BP Location: Left Arm)   Pulse 75   Resp 18   Ht 5' 7.5" (1.715 m)   Wt 175 lb 12.8 oz (79.7 kg)   BMI 27.13 kg/m  Gen:  WD/WN, NAD. Appears younger than stated age. Head: [place]/AT, No temporalis wasting.  Ear/Nose/Throat: Hearing grossly intact, nares w/o erythema or  drainage, oropharynx w/o Erythema/Exudate Eyes: Conjunctiva clear, sclera non-icteric  Neck: trachea midline.  No JVD.  Pulmonary:  Good air movement, respirations not labored, no use of accessory muscles  Cardiac: irregular Vascular:  Vessel Right Left  Radial Palpable Palpable  DP 1+ NP  PT Trace  NP   Gastrointestinal:. No masses, surgical incisions, or scars. Musculoskeletal: M/S 5/5 throughout.  Extremities without ischemic changes.  No deformity or atrophy.  1+ right lower extremity edema, 2-3+ left lower extremity edema.  Skin has a few scabs and a lot of scaling stasis dermatitis changes with hyperpigmentation worse on the left than the right.  Diffuse varicosities bilaterally. Neurologic: Sensation grossly intact in extremities.  Symmetrical.  Speech is fluent. Motor exam as listed above. Psychiatric: Judgment intact, Mood & affect appropriate for pt's clinical situation. Dermatologic: No rashes or ulcers noted.  No cellulitis or open wounds.    Radiology No results found.  Labs No results found for this or any previous visit (from the past 2160 hour(s)).  Assessment/Plan:  Swelling of limb Recommend:  I have had a long discussion with the patient regarding swelling and why it  causes symptoms.  Patient would placed an Unna boot in the left leg until the skin is healed and the swelling is much more under control.  He will continue wearing a compression sock on the right leg. Patient will begin wearing graduated compression on a daily basis. The patient will  wear the stockings first thing in the morning and removing them in the evening. The patient is instructed specifically not to sleep in the stockings.   In addition, behavioral modification will be initiated.  This will include frequent elevation, use of over the counter pain medications and exercise such as walking.  Consideration for a lymph pump will also be made based upon the  effectiveness of conservative therapy.  This would help to improve the edema control and prevent sequela such as ulcers and infections   Patient should undergo duplex ultrasound of the venous system to ensure that DVT or reflux is not present.  The patient will follow-up with me after the ultrasound.   Hyperlipidemia lipid control important in reducing the progression of atherosclerotic disease. Continue statin therapy   Hypertension blood pressure control important in reducing the progression of atherosclerotic disease. On appropriate oral medications.   Dilated cardiomyopathy (HCC) This would definitely worsen his lower extremity swelling.      Festus Barren 07/06/2023, 4:48 PM   This note was created with Dragon medical transcription system.  Any errors from dictation are unintentional.

## 2023-07-06 NOTE — Assessment & Plan Note (Signed)
blood pressure control important in reducing the progression of atherosclerotic disease. On appropriate oral medications.  

## 2023-07-06 NOTE — Assessment & Plan Note (Signed)
lipid control important in reducing the progression of atherosclerotic disease. Continue statin therapy  

## 2023-07-13 ENCOUNTER — Encounter (INDEPENDENT_AMBULATORY_CARE_PROVIDER_SITE_OTHER): Payer: Self-pay

## 2023-07-13 ENCOUNTER — Ambulatory Visit (INDEPENDENT_AMBULATORY_CARE_PROVIDER_SITE_OTHER): Payer: Medicare Other | Admitting: Nurse Practitioner

## 2023-07-13 VITALS — BP 148/80 | HR 86 | Resp 16 | Wt 176.0 lb

## 2023-07-13 DIAGNOSIS — M7989 Other specified soft tissue disorders: Secondary | ICD-10-CM

## 2023-07-13 NOTE — Progress Notes (Signed)
History of Present Illness  There is no documented history at this time  Assessments & Plan   There are no diagnoses linked to this encounter.    Additional instructions  Subjective:  Patient presents with venous ulcer of the Left lower extremity.    Procedure:  3 layer unna wrap was placed Left lower extremity.   Plan:   Follow up in one week.  

## 2023-07-16 ENCOUNTER — Encounter (INDEPENDENT_AMBULATORY_CARE_PROVIDER_SITE_OTHER): Payer: Medicare Other | Admitting: Vascular Surgery

## 2023-07-20 ENCOUNTER — Encounter (INDEPENDENT_AMBULATORY_CARE_PROVIDER_SITE_OTHER): Payer: Self-pay

## 2023-07-20 ENCOUNTER — Ambulatory Visit (INDEPENDENT_AMBULATORY_CARE_PROVIDER_SITE_OTHER): Payer: Medicare Other | Admitting: Nurse Practitioner

## 2023-07-20 VITALS — BP 133/84 | HR 82 | Resp 16 | Wt 176.8 lb

## 2023-07-20 DIAGNOSIS — M7989 Other specified soft tissue disorders: Secondary | ICD-10-CM

## 2023-07-20 NOTE — Progress Notes (Unsigned)
History of Present Illness  There is no documented history at this time  Assessments & Plan   There are no diagnoses linked to this encounter.    Additional instructions  Subjective:  Patient presents with venous ulcer of the Left lower extremity.    Procedure:  3 layer unna wrap was placed Left lower extremity.   Plan:   Follow up in one week.  

## 2023-07-21 ENCOUNTER — Encounter (INDEPENDENT_AMBULATORY_CARE_PROVIDER_SITE_OTHER): Payer: Self-pay | Admitting: Nurse Practitioner

## 2023-07-27 ENCOUNTER — Encounter (INDEPENDENT_AMBULATORY_CARE_PROVIDER_SITE_OTHER): Payer: Medicare Other

## 2023-07-27 ENCOUNTER — Ambulatory Visit (INDEPENDENT_AMBULATORY_CARE_PROVIDER_SITE_OTHER): Payer: Medicare Other

## 2023-07-27 ENCOUNTER — Encounter (INDEPENDENT_AMBULATORY_CARE_PROVIDER_SITE_OTHER): Payer: Self-pay | Admitting: Vascular Surgery

## 2023-07-27 ENCOUNTER — Ambulatory Visit (INDEPENDENT_AMBULATORY_CARE_PROVIDER_SITE_OTHER): Payer: Medicare Other | Admitting: Vascular Surgery

## 2023-07-27 VITALS — BP 159/77 | HR 71 | Resp 16 | Wt 177.0 lb

## 2023-07-27 DIAGNOSIS — I10 Essential (primary) hypertension: Secondary | ICD-10-CM | POA: Diagnosis not present

## 2023-07-27 DIAGNOSIS — I420 Dilated cardiomyopathy: Secondary | ICD-10-CM

## 2023-07-27 DIAGNOSIS — M7989 Other specified soft tissue disorders: Secondary | ICD-10-CM

## 2023-07-27 DIAGNOSIS — E785 Hyperlipidemia, unspecified: Secondary | ICD-10-CM | POA: Diagnosis not present

## 2023-07-27 NOTE — Assessment & Plan Note (Addendum)
A venous reflux study was performed today showing no DVT or superficial thrombophlebitis.  No venous reflux was identified in either lower extremity with the left great saphenous vein having been surgically harvested for coronary bypass many years ago.  His swelling is likely related much more to medical issues than any obvious vascular issue, although I do think there is likely a component of lymphedema from chronic scarring of the lymphatic channels.  No role for intervention.  At this point, he should be able to come out of his Unna boots and go to compression socks daily.  A new prescription for compression socks was given today.  I will plan to see him back in 2 to 3 months.

## 2023-07-27 NOTE — Progress Notes (Signed)
MRN : [id number]  Gregory [name] is a 89 y.o. (02-Feb-1932) male who presents with chief complaint of  Chief Complaint  Patient presents with   Follow-up    4 week unna and u/s follow up  .  History of Present Illness: Patient returns today in follow up of his leg swelling.  He has now been in [employer] for 3 to 4 weeks with marked improvement in his left leg swelling.  His left leg is essentially symmetric with his right leg at this point and really has only mild swelling.  No open wounds or infection.  No fevers or chills.  A venous reflux study was performed today showing no DVT or superficial thrombophlebitis.  No venous reflux was identified in either lower extremity with the left great saphenous vein having been surgically harvested for coronary bypass many years ago.  Current Outpatient Medications  Medication Sig Dispense Refill   acetaminophen (TYLENOL) 500 MG tablet Take 500 mg by mouth every 6 (six) hours as needed for headache.     apixaban (ELIQUIS) 2.5 MG TABS tablet Take 1 tablet (2.5 mg total) by mouth 2 (two) times daily for 14 days. 28 tablet 0   Choline Fenofibrate (FENOFIBRIC ACID) 135 MG CPDR Take 135 mg by mouth every morning.     lisinopril (ZESTRIL) 20 MG tablet Take 20 mg by mouth every morning.     metoprolol succinate (TOPROL-XL) 25 MG 24 hr tablet Take 12.5 mg by mouth every morning.     Multiple Vitamin (MULTIVITAMIN WITH MINERALS) TABS tablet Take 1 tablet by mouth daily.     oxyCODONE (OXY IR/ROXICODONE) 5 MG immediate release tablet Take 0.5-1 tablets (2.5-5 mg total) by mouth every 4 (four) hours as needed for moderate pain (pain score 4-6). 30 tablet 0   pravastatin (PRAVACHOL) 40 MG tablet Take 80 mg by mouth every evening.     psyllium (METAMUCIL) 58.6 % powder Take 1 packet by mouth daily.     traMADol (ULTRAM) 50 MG tablet Take 1 tablet (50 mg total) by mouth every 6 (six) hours as needed for moderate pain or severe pain (Or for breakthrough pain). 30  tablet 0   No current facility-administered medications for this visit.    Past Medical History:  Diagnosis Date   Arthritis    CAD (coronary artery disease) 08/05/2005   a.) LHC 08/05/2005: 20% pLM, 60% pLAD, 80% mLAD, 100% OM1, 40% pRCA, 100% mRCA -- CVTS consult; b.) 4v CABG 08/07/2005   Carotid stenosis    DDD (degenerative disc disease), lumbar    Diastolic dysfunction    a.) TTE 08/30/2012: EF 45%, lat HK, triv MR, G1DD; b.) TTE 12/10/2016: EF 40%, mild LAE, mild panvalvular regurg; c.) TTE 10/05/2019: EF 50%, mild BAE, mild RVE, triv AR/PR, mild MR/TR, G1DD   Dilated cardiomyopathy (HCC)    a.) TTE 08/30/2012: EF 45%; b.) TTE 12/10/2016: EF 40%; c.) MPI 08/22/2019: EF 57%; d.) TTE 10/05/2019: EF 50%   DOE (dyspnea on exertion)    Headache    History of bilateral cataract extraction    Hyperlipidemia    Hypertension    Myocardial infarction (HCC) 07/2005   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   a.) LIMA-LAD, SVG-D1, SVG-OM1, SVG-PDA   Valvular heart disease     Past Surgical History:  Procedure Laterality Date   CARPOMETACARPAL (CMC) FUSION OF THUMB Left 10/23/2020   Procedure: SUSPENSION ARTHROPLASTY OF LEFT THUMB CMC JOINT;  Surgeon: Christena Flake, MD;  Location: [facility];  Service: Orthopedics;  Laterality: Left;   CATARACT EXTRACTION Bilateral    CORONARY ARTERY BYPASS GRAFT N/A 08/07/2005   Procedure: CORONARY ARTERY BYPASS GRAFT; Location: [facility]; Surgeon: Marshell Garfinkel, MD   ESOPHAGEAL DILATION     x2   KNEE ARTHROSCOPY     ROTATOR CUFF REPAIR Right    TONSILLECTOMY     TOTAL KNEE ARTHROPLASTY Left 12/15/2022   Procedure: TOTAL KNEE ARTHROPLASTY - RNFA;  Surgeon: Christena Flake, MD;  Location: [facility];  Service: Orthopedics;  Laterality: Left;     Social History   Tobacco Use   Smoking status: Never   Smokeless tobacco: Never  Vaping Use   Vaping status: Never Used  Substance Use Topics   Alcohol use: No   Drug use: No      No family history on file.   Allergies   Allergen Reactions   Hydrocodone Nausea Only   Codeine Nausea Only     REVIEW OF SYSTEMS (Negative unless checked)   Constitutional: [] Weight loss  [] Fever  [] Chills Cardiac: [] Chest pain   [] Chest pressure   [] Palpitations   [] Shortness of breath when laying flat   [] Shortness of breath at rest   [] Shortness of breath with exertion. Vascular:  [] Pain in legs with walking   [] Pain in legs at rest   [] Pain in legs when laying flat   [] Claudication   [] Pain in feet when walking  [] Pain in feet at rest  [] Pain in feet when laying flat   [] History of DVT   [] Phlebitis   [x] Swelling in legs   [] Varicose veins   [] Non-healing ulcers Pulmonary:   [] Uses home oxygen   [] Productive cough   [] Hemoptysis   [] Wheeze  [] COPD   [] Asthma Neurologic:  [] Dizziness  [] Blackouts   [] Seizures   [] History of stroke   [] History of TIA  [] Aphasia   [] Temporary blindness   [] Dysphagia   [] Weakness or numbness in arms   [] Weakness or numbness in legs Musculoskeletal:  [x] Arthritis   [] Joint swelling   [] Joint pain   [] Low back pain Hematologic:  [] Easy bruising  [] Easy bleeding   [] Hypercoagulable state   [] Anemic  [] Hepatitis Gastrointestinal:  [] Blood in stool   [] Vomiting blood  [] Gastroesophageal reflux/heartburn   [] Abdominal pain Genitourinary:  [] Chronic kidney disease   [] Difficult urination  [] Frequent urination  [] Burning with urination   [] Hematuria Skin:  [] Rashes   [x] Ulcers   [x] Wounds Psychological:  [] History of anxiety   []  History of major depression.  Physical Examination  BP (!) 159/77 (BP Location: Right Arm)   Pulse 71   Resp 16   Wt 177 lb (80.3 kg)   BMI 27.31 kg/m  Gen:  WD/WN, NAD Head: [place]/AT, No temporalis wasting. Ear/Nose/Throat: Hearing grossly intact, nares w/o erythema or drainage Eyes: Conjunctiva clear. Sclera non-icteric Neck: Supple.  Trachea midline Pulmonary:  Good air movement, no use of accessory muscles.  Cardiac: RRR, no JVD Vascular:  Vessel Right Left  Radial  Palpable Palpable           Musculoskeletal: M/S 5/5 throughout.  No deformity or atrophy.  1+ bilateral lower extremity edema. Neurologic: Sensation grossly intact in extremities.  Symmetrical.  Speech is fluent.  Psychiatric: Judgment intact, Mood & affect appropriate for pt's clinical situation. Dermatologic: No rashes or ulcers noted.  No cellulitis or open wounds.      Labs No results found for this or any previous visit (from the past 2160 hour(s)).  Radiology No  results found.  Assessment/Plan  Swelling of limb A venous reflux study was performed today showing no DVT or superficial thrombophlebitis.  No venous reflux was identified in either lower extremity with the left great saphenous vein having been surgically harvested for coronary bypass many years ago.  His swelling is likely related much more to medical issues than any obvious vascular issue, although I do think there is likely a component of lymphedema from chronic scarring of the lymphatic channels.  No role for intervention.  At this point, he should be able to come out of his Unna boots and go to compression socks daily.  A new prescription for compression socks was given today.  I will plan to see him back in 2 to 3 months.  Hyperlipidemia lipid control important in reducing the progression of atherosclerotic disease. Continue statin therapy     Hypertension blood pressure control important in reducing the progression of atherosclerotic disease. On appropriate oral medications.     Dilated cardiomyopathy (HCC) This would definitely worsen his lower extremity swelling.  Festus Barren, MD  07/27/2023 11:28 AM    This note was created with Dragon medical transcription system.  Any errors from dictation are purely unintentional

## 2023-08-03 ENCOUNTER — Telehealth (INDEPENDENT_AMBULATORY_CARE_PROVIDER_SITE_OTHER): Payer: Self-pay

## 2023-08-03 NOTE — Telephone Encounter (Signed)
Gregory [name] called stating that he tried wearing the compression one time and he will not wear there again. He stated his legs are red and pink and has swollen 4-5 inches

## 2023-08-03 NOTE — Telephone Encounter (Signed)
Per Dr. Wyn Quaker- Patient should go back into [employer].  Patient stated he waited a few days to wear the compression stockings which lead to his legs to now weep. He agreed to come in and start having them wrapped again.   Please call patient to get him scheduled- 4 weeks with a follow up with Dr. Wyn Quaker

## 2023-08-04 ENCOUNTER — Ambulatory Visit (INDEPENDENT_AMBULATORY_CARE_PROVIDER_SITE_OTHER): Payer: Medicare Other | Admitting: Nurse Practitioner

## 2023-08-04 ENCOUNTER — Encounter (INDEPENDENT_AMBULATORY_CARE_PROVIDER_SITE_OTHER): Payer: Self-pay

## 2023-08-04 VITALS — BP 139/79 | HR 81

## 2023-08-04 DIAGNOSIS — M7989 Other specified soft tissue disorders: Secondary | ICD-10-CM

## 2023-08-04 NOTE — Progress Notes (Signed)
History of Present Illness  There is no documented history at this time  Assessments & Plan   There are no diagnoses linked to this encounter.    Additional instructions  Subjective:  Patient presents with venous ulcer of the Left lower extremity.    Procedure:  3 layer unna wrap was placed Left lower extremity.   Plan:   Follow up in one week.  

## 2023-08-11 ENCOUNTER — Encounter (INDEPENDENT_AMBULATORY_CARE_PROVIDER_SITE_OTHER): Payer: Self-pay

## 2023-08-11 ENCOUNTER — Ambulatory Visit (INDEPENDENT_AMBULATORY_CARE_PROVIDER_SITE_OTHER): Payer: Medicare Other | Admitting: Nurse Practitioner

## 2023-08-11 VITALS — BP 147/72 | HR 72 | Resp 16

## 2023-08-11 DIAGNOSIS — M7989 Other specified soft tissue disorders: Secondary | ICD-10-CM

## 2023-08-11 NOTE — Progress Notes (Signed)
History of Present Illness  There is no documented history at this time  Assessments & Plan   There are no diagnoses linked to this encounter.    Additional instructions  Subjective:  Patient presents with venous ulcer of the Left lower extremity.    Procedure:  3 layer unna wrap was placed Left lower extremity.   Plan:   Follow up in one week.  

## 2023-08-18 ENCOUNTER — Encounter (INDEPENDENT_AMBULATORY_CARE_PROVIDER_SITE_OTHER): Payer: Self-pay

## 2023-08-18 ENCOUNTER — Ambulatory Visit (INDEPENDENT_AMBULATORY_CARE_PROVIDER_SITE_OTHER): Payer: Medicare Other | Admitting: Nurse Practitioner

## 2023-08-18 VITALS — BP 153/91 | HR 76 | Resp 16

## 2023-08-18 DIAGNOSIS — M7989 Other specified soft tissue disorders: Secondary | ICD-10-CM

## 2023-08-18 NOTE — Progress Notes (Signed)
History of Present Illness  There is no documented history at this time  Assessments & Plan   There are no diagnoses linked to this encounter.    Additional instructions  Subjective:  Patient presents with venous ulcer of the Left lower extremity.    Procedure:  3 layer unna wrap was placed Left lower extremity.   Plan:   Follow up in one week.  

## 2023-08-25 ENCOUNTER — Encounter (INDEPENDENT_AMBULATORY_CARE_PROVIDER_SITE_OTHER): Payer: Medicare Other

## 2023-08-26 ENCOUNTER — Encounter (INDEPENDENT_AMBULATORY_CARE_PROVIDER_SITE_OTHER): Payer: Self-pay

## 2023-08-26 ENCOUNTER — Ambulatory Visit (INDEPENDENT_AMBULATORY_CARE_PROVIDER_SITE_OTHER): Payer: Medicare Other | Admitting: Nurse Practitioner

## 2023-08-26 VITALS — BP 145/77 | HR 80 | Resp 16

## 2023-08-26 DIAGNOSIS — M7989 Other specified soft tissue disorders: Secondary | ICD-10-CM | POA: Diagnosis not present

## 2023-08-26 NOTE — Progress Notes (Signed)
History of Present Illness  There is no documented history at this time  Assessments & Plan   There are no diagnoses linked to this encounter.    Additional instructions  Subjective:  Patient presents with venous ulcer of the Left lower extremity.    Procedure:  3 layer unna wrap was placed Left lower extremity.   Plan:   Follow up in one week.  

## 2023-08-29 ENCOUNTER — Encounter (INDEPENDENT_AMBULATORY_CARE_PROVIDER_SITE_OTHER): Payer: Self-pay | Admitting: Nurse Practitioner

## 2023-09-03 ENCOUNTER — Encounter (INDEPENDENT_AMBULATORY_CARE_PROVIDER_SITE_OTHER): Payer: Self-pay | Admitting: Vascular Surgery

## 2023-09-03 ENCOUNTER — Ambulatory Visit (INDEPENDENT_AMBULATORY_CARE_PROVIDER_SITE_OTHER): Payer: Medicare Other | Admitting: Vascular Surgery

## 2023-09-03 VITALS — BP 162/67 | HR 72 | Resp 18 | Ht 67.25 in | Wt 175.2 lb

## 2023-09-03 DIAGNOSIS — I420 Dilated cardiomyopathy: Secondary | ICD-10-CM | POA: Diagnosis not present

## 2023-09-03 DIAGNOSIS — I10 Essential (primary) hypertension: Secondary | ICD-10-CM | POA: Diagnosis not present

## 2023-09-03 DIAGNOSIS — E785 Hyperlipidemia, unspecified: Secondary | ICD-10-CM

## 2023-09-03 DIAGNOSIS — M7989 Other specified soft tissue disorders: Secondary | ICD-10-CM | POA: Diagnosis not present

## 2023-09-03 NOTE — Progress Notes (Signed)
MRN : [id number]  Gregory [name] is a 89 y.o. (10/28/1931) male who presents with chief complaint of  Chief Complaint  Patient presents with   Follow-up    unna f/u  .  History of Present Illness: Patient returns today in follow up of his leg swelling.  His leg swelling continues to improve with compression over the past 2 months.  Skin is now intact with only a couple of small scabs in the left lower leg which are not open wounds.  Not having much pain.  Functional status seems to be better over the past couple of months as well.  No chest pain or shortness of breath.  No fever or chills.  Current Outpatient Medications  Medication Sig Dispense Refill   acetaminophen (TYLENOL) 500 MG tablet Take 500 mg by mouth every 6 (six) hours as needed for headache.     apixaban (ELIQUIS) 2.5 MG TABS tablet Take 1 tablet (2.5 mg total) by mouth 2 (two) times daily for 14 days. 28 tablet 0   Choline Fenofibrate (FENOFIBRIC ACID) 135 MG CPDR Take 135 mg by mouth every morning.     lisinopril (ZESTRIL) 20 MG tablet Take 20 mg by mouth every morning.     metoprolol succinate (TOPROL-XL) 25 MG 24 hr tablet Take 12.5 mg by mouth every morning.     Multiple Vitamin (MULTIVITAMIN WITH MINERALS) TABS tablet Take 1 tablet by mouth daily.     pravastatin (PRAVACHOL) 40 MG tablet Take 80 mg by mouth every evening.     psyllium (METAMUCIL) 58.6 % powder Take 1 packet by mouth daily.     traMADol (ULTRAM) 50 MG tablet Take 1 tablet (50 mg total) by mouth every 6 (six) hours as needed for moderate pain or severe pain (Or for breakthrough pain). 30 tablet 0   oxyCODONE (OXY IR/ROXICODONE) 5 MG immediate release tablet Take 0.5-1 tablets (2.5-5 mg total) by mouth every 4 (four) hours as needed for moderate pain (pain score 4-6). (Patient not taking: Reported on 08/11/2023) 30 tablet 0   No current facility-administered medications for this visit.    Past Medical History:  Diagnosis Date   Arthritis    CAD  (coronary artery disease) 08/05/2005   a.) LHC 08/05/2005: 20% pLM, 60% pLAD, 80% mLAD, 100% OM1, 40% pRCA, 100% mRCA -- CVTS consult; b.) 4v CABG 08/07/2005   Carotid stenosis    DDD (degenerative disc disease), lumbar    Diastolic dysfunction    a.) TTE 08/30/2012: EF 45%, lat HK, triv MR, G1DD; b.) TTE 12/10/2016: EF 40%, mild LAE, mild panvalvular regurg; c.) TTE 10/05/2019: EF 50%, mild BAE, mild RVE, triv AR/PR, mild MR/TR, G1DD   Dilated cardiomyopathy (HCC)    a.) TTE 08/30/2012: EF 45%; b.) TTE 12/10/2016: EF 40%; c.) MPI 08/22/2019: EF 57%; d.) TTE 10/05/2019: EF 50%   DOE (dyspnea on exertion)    Headache    History of bilateral cataract extraction    Hyperlipidemia    Hypertension    Myocardial infarction (HCC) 07/2005   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   a.) LIMA-LAD, SVG-D1, SVG-OM1, SVG-PDA   Valvular heart disease     Past Surgical History:  Procedure Laterality Date   CARPOMETACARPAL (CMC) FUSION OF THUMB Left 10/23/2020   Procedure: SUSPENSION ARTHROPLASTY OF LEFT THUMB CMC JOINT;  Surgeon: Christena Flake, MD;  Location: [facility];  Service: Orthopedics;  Laterality: Left;   CATARACT EXTRACTION Bilateral    CORONARY ARTERY BYPASS GRAFT N/A  08/07/2005   Procedure: CORONARY ARTERY BYPASS GRAFT; Location: [facility]; Surgeon: Marshell Garfinkel, MD   ESOPHAGEAL DILATION     x2   KNEE ARTHROSCOPY     ROTATOR CUFF REPAIR Right    TONSILLECTOMY     TOTAL KNEE ARTHROPLASTY Left 12/15/2022   Procedure: TOTAL KNEE ARTHROPLASTY - RNFA;  Surgeon: Christena Flake, MD;  Location: [facility];  Service: Orthopedics;  Laterality: Left;     Social History   Tobacco Use   Smoking status: Never   Smokeless tobacco: Never  Vaping Use   Vaping status: Never Used  Substance Use Topics   Alcohol use: No   Drug use: No      History reviewed. No pertinent family history.   Allergies  Allergen Reactions   Hydrocodone Nausea Only   Codeine Nausea Only      REVIEW OF SYSTEMS (Negative unless  checked)   Constitutional: [] Weight loss  [] Fever  [] Chills Cardiac: [] Chest pain   [] Chest pressure   [] Palpitations   [] Shortness of breath when laying flat   [] Shortness of breath at rest   [] Shortness of breath with exertion. Vascular:  [] Pain in legs with walking   [] Pain in legs at rest   [] Pain in legs when laying flat   [] Claudication   [] Pain in feet when walking  [] Pain in feet at rest  [] Pain in feet when laying flat   [] History of DVT   [] Phlebitis   [x] Swelling in legs   [] Varicose veins   [] Non-healing ulcers Pulmonary:   [] Uses home oxygen   [] Productive cough   [] Hemoptysis   [] Wheeze  [] COPD   [] Asthma Neurologic:  [] Dizziness  [] Blackouts   [] Seizures   [] History of stroke   [] History of TIA  [] Aphasia   [] Temporary blindness   [] Dysphagia   [] Weakness or numbness in arms   [] Weakness or numbness in legs Musculoskeletal:  [x] Arthritis   [] Joint swelling   [] Joint pain   [] Low back pain Hematologic:  [] Easy bruising  [] Easy bleeding   [] Hypercoagulable state   [] Anemic  [] Hepatitis Gastrointestinal:  [] Blood in stool   [] Vomiting blood  [] Gastroesophageal reflux/heartburn   [] Abdominal pain Genitourinary:  [] Chronic kidney disease   [] Difficult urination  [] Frequent urination  [] Burning with urination   [] Hematuria Skin:  [] Rashes   [x] Ulcers   [x] Wounds Psychological:  [] History of anxiety   []  History of major depression.  Physical Examination  BP (!) 162/67   Pulse 72   Resp 18   Ht 5' 7.25" (1.708 m)   Wt 175 lb 3.2 oz (79.5 kg)   BMI 27.24 kg/m  Gen:  WD/WN, NAD Head: [place]/AT, No temporalis wasting. Ear/Nose/Throat: Hearing grossly intact, nares w/o erythema or drainage Eyes: Conjunctiva clear. Sclera non-icteric Neck: Supple.  Trachea midline Pulmonary:  Good air movement, no use of accessory muscles.  Cardiac: RRR, no JVD Vascular:  Vessel Right Left  Radial Palpable Palpable                   Musculoskeletal: M/S 5/5 throughout.  No deformity or atrophy.   Trace right lower extremity edema, 1+ left lower extremity edema. Neurologic: Sensation grossly intact in extremities.  Symmetrical.  Speech is fluent.  Psychiatric: Judgment intact, Mood & affect appropriate for pt's clinical situation. Dermatologic: No rashes or ulcers noted.  No cellulitis or open wounds.      Labs No results found for this or any previous visit (from the past 2160 hours).  Radiology No results found.  Assessment/Plan  Swelling of limb Swelling is better.  No venous reflux seen previously on study.  Compression socks daily.  Elevate and exercise.  Follow-up in 2 to 3 months.  Hyperlipidemia lipid control important in reducing the progression of atherosclerotic disease. Continue statin therapy     Hypertension blood pressure control important in reducing the progression of atherosclerotic disease. On appropriate oral medications.     Dilated cardiomyopathy (HCC) This would definitely worsen his lower extremity swelling.  Festus Barren, MD  09/03/2023 11:04 AM    This note was created with Dragon medical transcription system.  Any errors from dictation are purely unintentional

## 2023-09-03 NOTE — Assessment & Plan Note (Signed)
Swelling is better.  No venous reflux seen previously on study.  Compression socks daily.  Elevate and exercise.  Follow-up in 2 to 3 months.

## 2023-09-14 ENCOUNTER — Encounter: Payer: Self-pay | Admitting: Emergency Medicine

## 2023-09-14 ENCOUNTER — Ambulatory Visit
Admission: EM | Admit: 2023-09-14 | Discharge: 2023-09-14 | Disposition: A | Discharge status: Home / Self Care | Payer: Medicare Other

## 2023-09-14 ENCOUNTER — Other Ambulatory Visit: Payer: Self-pay

## 2023-09-14 DIAGNOSIS — T161XXA Foreign body in right ear, initial encounter: Secondary | ICD-10-CM | POA: Diagnosis not present

## 2023-09-14 NOTE — ED Triage Notes (Signed)
Reports q-tip, cotton swab is in right ear.  This occurred today.  Denies pain.  Denies any hearing issues

## 2023-09-14 NOTE — ED Provider Notes (Signed)
 [facility]    CSN: [id number] Arrival date & time: 09/14/23  1523      History   Chief Complaint Chief Complaint  Patient presents with   Foreign Body in Ear    HPI Gregory [name] is a 89 y.o. male presenting for foreign body of the right ear canal.  Patient reports he was cleaning his ears out after a shower with a Q-tip.  He says whenever he pulled the Q-tip out he noticed the cotton swab was not attached.  He says that his brother tried to help him by using tweezers to remove the cotton swab but only was able to retrieve a few pieces.  He believes he still has cotton in his ear and would like it to be removed.  Denies pain or hearing issues.  No other complaints.  HPI  Past Medical History:  Diagnosis Date   Arthritis    CAD (coronary artery disease) 08/05/2005   a.) LHC 08/05/2005: 20% pLM, 60% pLAD, 80% mLAD, 100% OM1, 40% pRCA, 100% mRCA -- CVTS consult; b.) 4v CABG 08/07/2005   Carotid stenosis    DDD (degenerative disc disease), lumbar    Diastolic dysfunction    a.) TTE 08/30/2012: EF 45%, lat HK, triv MR, G1DD; b.) TTE 12/10/2016: EF 40%, mild LAE, mild panvalvular regurg; c.) TTE 10/05/2019: EF 50%, mild BAE, mild RVE, triv AR/PR, mild MR/TR, G1DD   Dilated cardiomyopathy (HCC)    a.) TTE 08/30/2012: EF 45%; b.) TTE 12/10/2016: EF 40%; c.) MPI 08/22/2019: EF 57%; d.) TTE 10/05/2019: EF 50%   DOE (dyspnea on exertion)    Headache    History of bilateral cataract extraction    Hyperlipidemia    Hypertension    Myocardial infarction (HCC) 07/2005   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   a.) LIMA-LAD, SVG-D1, SVG-OM1, SVG-PDA   Valvular heart disease     Patient Active Problem List   Diagnosis Date Noted   Swelling of limb 07/06/2023   Hyperlipidemia    Hypertension    Dilated cardiomyopathy (HCC)    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   Status post total knee replacement using cement, 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    Past Surgical History:  Procedure  Laterality Date   CARPOMETACARPAL (CMC) FUSION OF THUMB Left 10/23/2020   Procedure: SUSPENSION ARTHROPLASTY OF LEFT THUMB CMC JOINT;  Surgeon: Edie Norleen PARAS, MD;  Location: [facility];  Service: Orthopedics;  Laterality: Left;   CATARACT EXTRACTION Bilateral    CORONARY ARTERY BYPASS GRAFT N/A 08/07/2005   Procedure: CORONARY ARTERY BYPASS GRAFT; Location: [facility]; Surgeon: Maude Sharps, MD   ESOPHAGEAL DILATION     x2   KNEE ARTHROSCOPY     ROTATOR CUFF REPAIR Right    TONSILLECTOMY     TOTAL KNEE ARTHROPLASTY Left 12/15/2022   Procedure: TOTAL KNEE ARTHROPLASTY - RNFA;  Surgeon: Edie Norleen PARAS, MD;  Location: [facility];  Service: Orthopedics;  Laterality: Left;       Home Medications    Prior to Admission medications   Medication Sig Start Date End Date Taking? Authorizing Provider  acetaminophen  (TYLENOL ) 500 MG tablet Take 500 mg by mouth every 6 (six) hours as needed for headache.    [provider]  apixaban  (ELIQUIS ) 2.5 MG TABS tablet Take 1 tablet (2.5 mg total) by mouth 2 (two) times daily for 14 days. 12/16/22 07/05/24  Verlinda Boas, PA-C  Choline Fenofibrate  (FENOFIBRIC ACID) 135 MG CPDR Take 135 mg by mouth every morning.  [provider]  lisinopril  (ZESTRIL ) 20 MG tablet Take 20 mg by mouth every morning.    [provider]  metoprolol  succinate (TOPROL -XL) 25 MG 24 hr tablet Take 12.5 mg by mouth every morning.    [provider]  Multiple Vitamin (MULTIVITAMIN WITH MINERALS) TABS tablet Take 1 tablet by mouth daily.    [provider]  oxyCODONE  (OXY IR/ROXICODONE ) 5 MG immediate release tablet Take 0.5-1 tablets (2.5-5 mg total) by mouth every 4 (four) hours as needed for moderate pain (pain score 4-6). Patient not taking: Reported on 08/11/2023 12/16/22   Verlinda Boas, PA-C  pravastatin  (PRAVACHOL ) 40 MG tablet Take 80 mg by mouth every evening.    [provider]  psyllium (METAMUCIL) 58.6 % powder Take 1 packet by mouth  daily.    [provider]  traMADol  (ULTRAM ) 50 MG tablet Take 1 tablet (50 mg total) by mouth every 6 (six) hours as needed for moderate pain or severe pain (Or for breakthrough pain). Patient not taking: Reported on 09/14/2023 12/16/22   Verlinda Boas, PA-C    Family History History reviewed. No pertinent family history.  Social History Social History   Tobacco Use   Smoking status: Never   Smokeless tobacco: Never  Vaping Use   Vaping status: Never Used  Substance Use Topics   Alcohol use: No   Drug use: No     Allergies   Hydrocodone and Codeine   Review of Systems Review of Systems  HENT:  Negative for ear discharge, ear pain and hearing loss.        FB right ear canal     Physical Exam Triage Vital Signs ED Triage Vitals  Encounter Vitals Group     BP 09/14/23 1817 (!) 171/86     Systolic BP Percentile --      Diastolic BP Percentile --      Pulse Rate 09/14/23 1817 81     Resp 09/14/23 1817 18     Temp 09/14/23 1817 98.1 F (36.7 C)     Temp Source 09/14/23 1817 Oral     SpO2 09/14/23 1817 100 %     Weight --      Height --      Head Circumference --      Peak Flow --      Pain Score 09/14/23 1814 0     Pain Loc --      Pain Education --      Exclude from Growth Chart --    No data found.  Updated Vital Signs BP (!) 171/86 (BP Location: Right Arm)   Pulse 81   Temp 98.1 F (36.7 C) (Oral)   Resp 18   SpO2 100%      Physical Exam Vitals and nursing note reviewed.  Constitutional:      General: He is not in acute distress.    Appearance: Normal appearance. He is well-developed. He is not ill-appearing.  HENT:     Head: Normocephalic and atraumatic.     Right Ear: External ear normal.     Ears:     Comments: Cotton in right EAC Eyes:     General: No scleral icterus.    Conjunctiva/sclera: Conjunctivae normal.  Cardiovascular:     Rate and Rhythm: Normal rate.  Pulmonary:     Effort: Pulmonary effort is normal. No respiratory  distress.  Musculoskeletal:     Cervical back: Neck supple.  Skin:    General: Skin is  warm and dry.     Capillary Refill: Capillary refill takes less than 2 seconds.  Neurological:     General: No focal deficit present.     Mental Status: He is alert. Mental status is at baseline.     Motor: No weakness.  Psychiatric:        Mood and Affect: Mood normal.        Behavior: Behavior normal.      UC Treatments / Results  Labs (all labs ordered are listed, but only abnormal results are displayed) Labs Reviewed - No data to display  EKG   Radiology No results found.  Procedures Procedures (including critical care time)  Medications Ordered in UC Medications - No data to display  Initial Impression / Assessment and Plan / UC Course  I have reviewed the triage vital signs and the nursing notes.  Pertinent labs & imaging results that were available during my care of the patient were reviewed by me and considered in my medical decision making (see chart for details).   89 year old male presents for cotton swab and right ear canal.  Cotton swab is the tip of the Q-tip.  Brother unsuccessfully removed portion.  On exam he does have obvious cotton in the right ear canal.  He is agreeable to a foreign body removal.  Used sterile forceps to remove 1 cotton swab string fully intact.  After foreign body removal, no evidence of injury.  TM intact.  No bleeding or sign infection.  Patient reported relief of blockage and no other concerns.  Declines AVS.  Advised to follow-up as needed.   Final Clinical Impressions(s) / UC Diagnoses   Final diagnoses:  Acute foreign body of right ear canal, initial encounter   Discharge Instructions   None    ED Prescriptions   None    PDMP not reviewed this encounter.   Arvis Jolan NOVAK, PA-C 09/14/23 1902

## 2023-10-26 ENCOUNTER — Ambulatory Visit (INDEPENDENT_AMBULATORY_CARE_PROVIDER_SITE_OTHER): Payer: Medicare Other | Admitting: Vascular Surgery

## 2023-10-26 ENCOUNTER — Encounter (INDEPENDENT_AMBULATORY_CARE_PROVIDER_SITE_OTHER): Payer: Self-pay | Admitting: Vascular Surgery

## 2023-10-26 VITALS — BP 155/90 | HR 82 | Resp 16 | Wt 176.2 lb

## 2023-10-26 DIAGNOSIS — I420 Dilated cardiomyopathy: Secondary | ICD-10-CM | POA: Diagnosis not present

## 2023-10-26 DIAGNOSIS — E785 Hyperlipidemia, unspecified: Secondary | ICD-10-CM | POA: Diagnosis not present

## 2023-10-26 DIAGNOSIS — I10 Essential (primary) hypertension: Secondary | ICD-10-CM

## 2023-10-26 DIAGNOSIS — M7989 Other specified soft tissue disorders: Secondary | ICD-10-CM | POA: Diagnosis not present

## 2023-10-26 NOTE — Progress Notes (Signed)
MRN : [id number]  Gregory [name] is a 89 y.o. (1932/05/07) male who presents with chief complaint of  Chief Complaint  Patient presents with   Follow-up    3 month follow up  .  History of Present Illness: Patient returns today in follow up of his leg swelling and previous weeping.  His legs have done very well with about 4 months of compression now at this point.  His swelling is minimal in both lower extremities.  He has some stasis dermatitis changes but no open ulceration or infection.  He is in good spirits today.  He is having no pain.  Current Outpatient Medications  Medication Sig Dispense Refill   acetaminophen (TYLENOL) 500 MG tablet Take 500 mg by mouth every 6 (six) hours as needed for headache.     apixaban (ELIQUIS) 2.5 MG TABS tablet Take 1 tablet (2.5 mg total) by mouth 2 (two) times daily for 14 days. 28 tablet 0   Choline Fenofibrate (FENOFIBRIC ACID) 135 MG CPDR Take 135 mg by mouth every morning.     lisinopril (ZESTRIL) 20 MG tablet Take 20 mg by mouth every morning.     metoprolol succinate (TOPROL-XL) 25 MG 24 hr tablet Take 12.5 mg by mouth every morning.     Multiple Vitamin (MULTIVITAMIN WITH MINERALS) TABS tablet Take 1 tablet by mouth daily.     oxyCODONE (OXY IR/ROXICODONE) 5 MG immediate release tablet Take 0.5-1 tablets (2.5-5 mg total) by mouth every 4 (four) hours as needed for moderate pain (pain score 4-6). (Patient not taking: Reported on 08/11/2023) 30 tablet 0   pravastatin (PRAVACHOL) 40 MG tablet Take 80 mg by mouth every evening.     psyllium (METAMUCIL) 58.6 % powder Take 1 packet by mouth daily.     traMADol (ULTRAM) 50 MG tablet Take 1 tablet (50 mg total) by mouth every 6 (six) hours as needed for moderate pain or severe pain (Or for breakthrough pain). (Patient not taking: Reported on 10/26/2023) 30 tablet 0   No current facility-administered medications for this visit.    Past Medical History:  Diagnosis Date   Arthritis    CAD (coronary  artery disease) 08/05/2005   a.) LHC 08/05/2005: 20% pLM, 60% pLAD, 80% mLAD, 100% OM1, 40% pRCA, 100% mRCA -- CVTS consult; b.) 4v CABG 08/07/2005   Carotid stenosis    DDD (degenerative disc disease), lumbar    Diastolic dysfunction    a.) TTE 08/30/2012: EF 45%, lat HK, triv MR, G1DD; b.) TTE 12/10/2016: EF 40%, mild LAE, mild panvalvular regurg; c.) TTE 10/05/2019: EF 50%, mild BAE, mild RVE, triv AR/PR, mild MR/TR, G1DD   Dilated cardiomyopathy (HCC)    a.) TTE 08/30/2012: EF 45%; b.) TTE 12/10/2016: EF 40%; c.) MPI 08/22/2019: EF 57%; d.) TTE 10/05/2019: EF 50%   DOE (dyspnea on exertion)    Headache    History of bilateral cataract extraction    Hyperlipidemia    Hypertension    Myocardial infarction (HCC) 07/2005   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   a.) LIMA-LAD, SVG-D1, SVG-OM1, SVG-PDA   Valvular heart disease     Past Surgical History:  Procedure Laterality Date   CARPOMETACARPAL (CMC) FUSION OF THUMB Left 10/23/2020   Procedure: SUSPENSION ARTHROPLASTY OF LEFT THUMB CMC JOINT;  Surgeon: Christena Flake, MD;  Location: [facility];  Service: Orthopedics;  Laterality: Left;   CATARACT EXTRACTION Bilateral    CORONARY ARTERY BYPASS GRAFT N/A 08/07/2005   Procedure: CORONARY ARTERY  BYPASS GRAFT; Location: [facility]; Surgeon: Marshell Garfinkel, MD   ESOPHAGEAL DILATION     x2   KNEE ARTHROSCOPY     ROTATOR CUFF REPAIR Right    TONSILLECTOMY     TOTAL KNEE ARTHROPLASTY Left 12/15/2022   Procedure: TOTAL KNEE ARTHROPLASTY - RNFA;  Surgeon: Christena Flake, MD;  Location: [facility];  Service: Orthopedics;  Laterality: Left;     Social History   Tobacco Use   Smoking status: Never   Smokeless tobacco: Never  Vaping Use   Vaping status: Never Used  Substance Use Topics   Alcohol use: No   Drug use: No      No family history on file.   Allergies  Allergen Reactions   Hydrocodone Nausea Only   Codeine Nausea Only     REVIEW OF SYSTEMS (Negative unless checked)   Constitutional:  [] Weight loss  [] Fever  [] Chills Cardiac: [] Chest pain   [] Chest pressure   [] Palpitations   [] Shortness of breath when laying flat   [] Shortness of breath at rest   [] Shortness of breath with exertion. Vascular:  [] Pain in legs with walking   [] Pain in legs at rest   [] Pain in legs when laying flat   [] Claudication   [] Pain in feet when walking  [] Pain in feet at rest  [] Pain in feet when laying flat   [] History of DVT   [] Phlebitis   [x] Swelling in legs   [] Varicose veins   [] Non-healing ulcers Pulmonary:   [] Uses home oxygen   [] Productive cough   [] Hemoptysis   [] Wheeze  [] COPD   [] Asthma Neurologic:  [] Dizziness  [] Blackouts   [] Seizures   [] History of stroke   [] History of TIA  [] Aphasia   [] Temporary blindness   [] Dysphagia   [] Weakness or numbness in arms   [] Weakness or numbness in legs Musculoskeletal:  [x] Arthritis   [] Joint swelling   [] Joint pain   [] Low back pain Hematologic:  [] Easy bruising  [] Easy bleeding   [] Hypercoagulable state   [] Anemic  [] Hepatitis Gastrointestinal:  [] Blood in stool   [] Vomiting blood  [] Gastroesophageal reflux/heartburn   [] Abdominal pain Genitourinary:  [] Chronic kidney disease   [] Difficult urination  [] Frequent urination  [] Burning with urination   [] Hematuria Skin:  [] Rashes   [x] Ulcers   [x] Wounds Psychological:  [] History of anxiety   []  History of major depression.  Physical Examination  BP (!) 155/90   Pulse 82   Resp 16   Wt 176 lb 3.2 oz (79.9 kg)   BMI 27.39 kg/m  Gen:  WD/WN, NAD. Appears younger than stated age. Head: [place]/AT, No temporalis wasting. Ear/Nose/Throat: Hearing grossly intact, nares w/o erythema or drainage Eyes: Conjunctiva clear. Sclera non-icteric Neck: Supple.  Trachea midline Pulmonary:  Good air movement, no use of accessory muscles.  Cardiac: RRR, no JVD Vascular:  Vessel Right Left  Radial Palpable Palpable                          PT Palpable Palpable  DP Palpable Palpable   Gastrointestinal: soft,  non-tender/non-distended. No guarding/reflex.  Musculoskeletal: M/S 5/5 throughout.  No deformity or atrophy.  Mild stasis dermatitis changes are present in both lower extremities.  Diffuse varicose veins are also present in both lower extremities.  Trace bilateral lower extremity edema. Neurologic: Sensation grossly intact in extremities.  Symmetrical.  Speech is fluent.  Psychiatric: Judgment intact, Mood & affect appropriate for pt's clinical situation. Dermatologic: No rashes or ulcers noted.  No  cellulitis or open wounds.      Labs No results found for this or any previous visit (from the past 2160 hours).  Radiology No results found.  Assessment/Plan  Swelling of limb Symptoms are markedly improved with regular use of compression socks and elevation.  At this point, he can follow-up as needed if his symptoms worsen.  Hyperlipidemia lipid control important in reducing the progression of atherosclerotic disease. Continue statin therapy     Hypertension blood pressure control important in reducing the progression of atherosclerotic disease. On appropriate oral medications.     Dilated cardiomyopathy (HCC) This would definitely worsen his lower extremity swelling.  Festus Barren, MD  10/26/2023 9:35 AM    This note was created with Dragon medical transcription system.  Any errors from dictation are purely unintentional

## 2023-10-26 NOTE — Assessment & Plan Note (Signed)
Symptoms are markedly improved with regular use of compression socks and elevation.  At this point, he can follow-up as needed if his symptoms worsen.

## 2024-09-05 ENCOUNTER — Other Ambulatory Visit: Payer: Self-pay

## 2024-09-05 DIAGNOSIS — R4189 Other symptoms and signs involving cognitive functions and awareness: Secondary | ICD-10-CM

## 2024-09-09 ENCOUNTER — Ambulatory Visit
Admission: RE | Admit: 2024-09-09 | Discharge: 2024-09-09 | Disposition: A | Discharge status: Home / Self Care | Source: Ambulatory Visit

## 2024-09-09 DIAGNOSIS — R4189 Other symptoms and signs involving cognitive functions and awareness: Secondary | ICD-10-CM | POA: Diagnosis present
